# Patient Record
Sex: Female | Born: 1995
Health system: Southern US, Community
[De-identification: ages and names within clinical notes are randomized; demographics above are authoritative.]

## PROBLEM LIST (undated history)

## (undated) DIAGNOSIS — E039 Hypothyroidism, unspecified: Secondary | ICD-10-CM

## (undated) DIAGNOSIS — F411 Generalized anxiety disorder: Secondary | ICD-10-CM

## (undated) DIAGNOSIS — T7840XA Allergy, unspecified, initial encounter: Secondary | ICD-10-CM

## (undated) DIAGNOSIS — N926 Irregular menstruation, unspecified: Secondary | ICD-10-CM

## (undated) DIAGNOSIS — E282 Polycystic ovarian syndrome: Secondary | ICD-10-CM

## (undated) DIAGNOSIS — L680 Hirsutism: Secondary | ICD-10-CM

## (undated) HISTORY — DX: Hirsutism: L68.0

## (undated) HISTORY — DX: Allergy, unspecified, initial encounter: T78.40XA

## (undated) HISTORY — DX: Generalized anxiety disorder: F41.1

## (undated) HISTORY — DX: Irregular menstruation, unspecified: N92.6

## (undated) HISTORY — DX: Polycystic ovarian syndrome: E28.2

## (undated) HISTORY — DX: Hypothyroidism, unspecified: E03.9

---
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
## 2016-06-26 ENCOUNTER — Encounter (HOSPITAL_COMMUNITY): Payer: Self-pay | Admitting: Emergency Medicine

## 2016-06-26 DIAGNOSIS — N3001 Acute cystitis with hematuria: Secondary | ICD-10-CM | POA: Diagnosis not present

## 2016-06-26 DIAGNOSIS — R05 Cough: Secondary | ICD-10-CM | POA: Insufficient documentation

## 2016-06-26 NOTE — ED Triage Notes (Signed)
Pt comes with complaints of generalized body aches, emesis, dry cough, and low grade fever. Ambulatory during triage.

## 2016-06-27 ENCOUNTER — Emergency Department (HOSPITAL_COMMUNITY)
Admission: EM | Admit: 2016-06-27 | Discharge: 2016-06-27 | Disposition: A | Discharge status: Home / Self Care | Payer: Federal, State, Local not specified - PPO | Attending: Emergency Medicine | Admitting: Emergency Medicine

## 2016-06-27 DIAGNOSIS — R6889 Other general symptoms and signs: Secondary | ICD-10-CM

## 2016-06-27 DIAGNOSIS — N3001 Acute cystitis with hematuria: Secondary | ICD-10-CM

## 2016-06-27 LAB — URINALYSIS, ROUTINE W REFLEX MICROSCOPIC
Bilirubin Urine: NEGATIVE
Glucose, UA: NEGATIVE mg/dL
Ketones, ur: NEGATIVE mg/dL
Nitrite: POSITIVE — AB
PROTEIN: 30 mg/dL — AB
Specific Gravity, Urine: 1.017 (ref 1.005–1.030)
pH: 6 (ref 5.0–8.0)

## 2016-06-27 LAB — POC URINE PREG, ED: Preg Test, Ur: NEGATIVE

## 2016-06-27 MED ORDER — ONDANSETRON 4 MG PO TBDP
4.0000 mg | ORAL_TABLET | Freq: Once | ORAL | Status: AC
  Administered 2016-06-27: 4 mg via ORAL
  Filled 2016-06-27: qty 1

## 2016-06-27 MED ORDER — BENZONATATE 100 MG PO CAPS: 100.0000 mg | ORAL_CAPSULE | Freq: Three times a day (TID) | ORAL | 0 refills | Status: DC

## 2016-06-27 MED ORDER — IBUPROFEN 800 MG PO TABS: 800.0000 mg | ORAL_TABLET | Freq: Three times a day (TID) | ORAL | 0 refills | Status: DC

## 2016-06-27 MED ORDER — ONDANSETRON HCL 4 MG PO TABS: 4.0000 mg | ORAL_TABLET | Freq: Four times a day (QID) | ORAL | 0 refills | Status: DC

## 2016-06-27 MED ORDER — CEPHALEXIN 500 MG PO CAPS: 500.0000 mg | ORAL_CAPSULE | Freq: Two times a day (BID) | ORAL | 0 refills | Status: DC

## 2016-06-27 MED ORDER — IBUPROFEN 200 MG PO TABS
600.0000 mg | ORAL_TABLET | Freq: Once | ORAL | Status: AC
  Administered 2016-06-27: 600 mg via ORAL
  Filled 2016-06-27: qty 3

## 2016-06-27 MED ORDER — CEPHALEXIN 500 MG PO CAPS
500.0000 mg | ORAL_CAPSULE | Freq: Once | ORAL | Status: AC
  Administered 2016-06-27: 500 mg via ORAL
  Filled 2016-06-27: qty 1

## 2016-06-27 MED ORDER — OSELTAMIVIR PHOSPHATE 75 MG PO CAPS: 75.0000 mg | ORAL_CAPSULE | Freq: Two times a day (BID) | ORAL | 0 refills | Status: DC

## 2016-06-27 NOTE — Discharge Instructions (Signed)
Medications: Keflex, Tamiflu, ibuprofen, Zofran, Tessalon  Treatment: Take Keflex twice daily for 1 week for your urinary tract infection. Take Tamiflu twice daily for 5 days. Take ibuprofen every for your body. You can alternate with Tylenol as degenerative over-the-counter. Take Zofran every 6 hours as needed for nausea and vomiting. Take Tessalon every 8 hours as needed for cough. Make sure to get plenty of rest and drink plenty of fluids (water and Gatorade).  Follow-up: Please return to emergency department if you develop any new or worsening symptoms.

## 2016-06-27 NOTE — ED Provider Notes (Signed)
[facility]-EMERGENCY DEPT Provider Note   CSN: [id number] Arrival date & time: 06/26/16  1945  By signing my name below, I, Modena Jansky, attest that this documentation has been prepared under the direction and in the presence of non-physician practitioner, Glenford Bayley, PA-C. Electronically Signed: Modena Jansky, Scribe. 06/27/2016. 12:50 AM.  History   Chief Complaint Chief Complaint  Patient presents with  . Flu Like Symptoms   The history is provided by the patient. No language interpreter was used.   HPI Comments: Tanya Hanson is a 21 y.o. female who presents to the Emergency Department complaining of intermittent moderate cough that started this morning. She states she had a sudden onset of gradually worsening URI-like symptoms. No treatment PTA. She reports associated symptoms of weakness (generalized), chills, nausea, vomiting (one episode), and subjective fever. Pt's temperature in the ED today was 99.9. She denies any influenza vaccination this year, diarrhea, urinary symptoms, or other complaints.    History reviewed. No pertinent past medical history.  There are no active problems to display for this patient.   History reviewed. No pertinent surgical history.  OB History    No data available       Home Medications    Prior to Admission medications   Medication Sig Start Date End Date Taking? Authorizing Provider  benzonatate (TESSALON) 100 MG capsule Take 1 capsule (100 mg total) by mouth every 8 (eight) hours. 06/27/16   Emi Holes, PA-C  cephALEXin (KEFLEX) 500 MG capsule Take 1 capsule (500 mg total) by mouth 2 (two) times daily. 06/27/16   Emi Holes, PA-C  ibuprofen (ADVIL,MOTRIN) 800 MG tablet Take 1 tablet (800 mg total) by mouth 3 (three) times daily. 06/27/16   Malaiya Paczkowski M Lionardo Haze, PA-C  ondansetron (ZOFRAN) 4 MG tablet Take 1 tablet (4 mg total) by mouth every 6 (six) hours. 06/27/16   Emi Holes, PA-C  oseltamivir (TAMIFLU) 75 MG capsule Take 1  capsule (75 mg total) by mouth every 12 (twelve) hours. 06/27/16   Emi Holes, PA-C    Family History No family history on file.  Social History Social History  Substance Use Topics  . Smoking status: Never Smoker  . Smokeless tobacco: Never Used  . Alcohol use No     Allergies   Patient has no known allergies.   Review of Systems Review of Systems  Constitutional: Positive for chills and fever (Subjective).  HENT: Negative for facial swelling and sore throat.   Respiratory: Positive for cough. Negative for shortness of breath.   Cardiovascular: Negative for chest pain.  Gastrointestinal: Positive for nausea and vomiting (one episode). Negative for abdominal pain and diarrhea.  Genitourinary: Negative for dysuria.  Musculoskeletal: Negative for back pain.  Skin: Negative for rash and wound.  Neurological: Positive for weakness (Generalized). Negative for headaches.  Psychiatric/Behavioral: The patient is not nervous/anxious.      Physical Exam Updated Vital Signs BP 116/97 (BP Location: Left Arm)   Pulse 101   Temp 100.3 F (37.9 C) (Oral)   Resp 19   Ht 5\' 5"  (1.651 m)   Wt 258 lb 6.4 oz (117.2 kg)   LMP 06/20/2016   SpO2 100%   BMI 43.00 kg/m   Physical Exam  Constitutional: She appears well-developed and well-nourished. No distress.  HENT:  Head: Normocephalic and atraumatic.  Right Ear: Tympanic membrane normal.  Left Ear: Tympanic membrane normal.  Mouth/Throat: Oropharynx is clear and moist. No oropharyngeal exudate.  Eyes: Conjunctivae are normal. Pupils  are equal, round, and reactive to light. Right eye exhibits no discharge. Left eye exhibits no discharge. No scleral icterus.  Neck: Normal range of motion. Neck supple. No thyromegaly present.  Cardiovascular: Regular rhythm, normal heart sounds and intact distal pulses.  Exam reveals no gallop and no friction rub.   No murmur heard. Pulmonary/Chest: Effort normal and breath sounds normal. No  stridor. No respiratory distress. She has no wheezes. She has no rales.  Abdominal: Soft. Bowel sounds are normal. She exhibits no distension. There is no tenderness. There is no rebound and no guarding.  Musculoskeletal: She exhibits no edema.  Lymphadenopathy:    She has no cervical adenopathy.  Neurological: She is alert. Coordination normal.  Skin: Skin is warm and dry. No rash noted. She is not diaphoretic. No pallor.  Psychiatric: She has a normal mood and affect.  Nursing note and vitals reviewed.    ED Treatments / Results  DIAGNOSTIC STUDIES: Oxygen Saturation is 100% on RA, normal by my interpretation.    COORDINATION OF CARE: 12:54 AM- Pt advised of plan for treatment and pt agrees.  Labs (all labs ordered are listed, but only abnormal results are displayed) Labs Reviewed  URINALYSIS, ROUTINE W REFLEX MICROSCOPIC - Abnormal; Notable for the following:       Result Value   APPearance CLOUDY (*)    Hgb urine dipstick SMALL (*)    Protein, ur 30 (*)    Nitrite POSITIVE (*)    Leukocytes, UA LARGE (*)    Bacteria, UA MANY (*)    Squamous Epithelial / LPF 0-5 (*)    All other components within normal limits  URINE CULTURE  POC URINE PREG, ED    EKG  EKG Interpretation None       Radiology No results found.  Procedures Procedures (including critical care time)  Medications Ordered in ED Medications  ibuprofen (ADVIL,MOTRIN) tablet 600 mg (600 mg Oral Given 06/27/16 0105)  ondansetron (ZOFRAN-ODT) disintegrating tablet 4 mg (4 mg Oral Given 06/27/16 0105)  cephALEXin (KEFLEX) capsule 500 mg (500 mg Oral Given 06/27/16 0121)     Initial Impression / Assessment and Plan / ED Course  I have reviewed the triage vital signs and the nursing notes.  Pertinent labs & imaging results that were available during my care of the patient were reviewed by me and considered in my medical decision making (see chart for details).     Patient with symptoms consistent  with influenza.  Vitals are stable, low-grade fever.  No signs of dehydration, tolerating PO's.  Lungs are clear. Due to patient's presentation and physical exam a chest x-ray was not ordered bc likely diagnosis of flu.  Discussed the cost versus benefit of Tamiflu treatment with the patient.  Patient would like prescription.  Patient will be discharged with instructions to orally hydrate, rest, and use over-the-counter medications such as anti-inflammatories ibuprofen and Aleve for muscle aches and Tylenol for fever.  Patient will also be given a cough suppressant. Patient also with urinary tract infection, UA showed small hematuria, positive nitrites, large leukocytes, too numerous to count WBCs, many bacteria. Sent. First dose of Keflex given in ED and discharged home with Keflex. Urine culture sent. Urine pregnancy negative. Patient tachycardia improved in ED with control of fever and oral hydration. Return precautions discussed. Patient understands and agrees with plan. Patient discharged in satisfactory condition.   Final Clinical Impressions(s) / ED Diagnoses   Final diagnoses:  Influenza-like symptoms  Acute cystitis with hematuria  New Prescriptions New Prescriptions   BENZONATATE (TESSALON) 100 MG CAPSULE    Take 1 capsule (100 mg total) by mouth every 8 (eight) hours.   CEPHALEXIN (KEFLEX) 500 MG CAPSULE    Take 1 capsule (500 mg total) by mouth 2 (two) times daily.   IBUPROFEN (ADVIL,MOTRIN) 800 MG TABLET    Take 1 tablet (800 mg total) by mouth 3 (three) times daily.   ONDANSETRON (ZOFRAN) 4 MG TABLET    Take 1 tablet (4 mg total) by mouth every 6 (six) hours.   OSELTAMIVIR (TAMIFLU) 75 MG CAPSULE    Take 1 capsule (75 mg total) by mouth every 12 (twelve) hours.   I personally performed the services described in this documentation, which was scribed in my presence. The recorded information has been reviewed and is accurate.     Emi Holeslexandra M Effrey Davidow, PA-C 06/27/16 [id number]    April  Palumbo, MD 06/27/16 [phone]

## 2016-06-27 NOTE — ED Notes (Signed)
Pt states she is unable to provide a urine specimen at this time.

## 2016-06-29 LAB — URINE CULTURE
Culture: >=100000 — AB
Special Requests: NORMAL

## 2016-06-30 ENCOUNTER — Telehealth: Payer: Self-pay | Admitting: Emergency Medicine

## 2016-06-30 NOTE — Telephone Encounter (Signed)
Post ED Visit - Positive Culture Follow-up  Culture report reviewed by antimicrobial stewardship pharmacist:  []  Enzo BiNathan Batchelder, Pharm.D. []  Celedonio MiyamotoJeremy Frens, Pharm.D., BCPS []  Garvin FilaMike Maccia, Pharm.D. []  Georgina PillionElizabeth Martin, Pharm.D., BCPS []  GalenaMinh Pham, [address]Pharm[address]D., BCPS, AAHIVP []  Estella HuskMichelle Turner, Pharm.D., BCPS, AAHIVP []  Tennis Mustassie Stewart, Pharm.D. []  Sherle Poeob Vincent, [address]Pharm[address]Carylon Perches. Maggie Shuda PharmD  Positive urine culture Treated with cephalexin, organism sensitive to the same and no further patient follow-up is required at this time.  Berle MullMiller, Joevanni Roddey 06/30/2016, 10:37 AM

## 2016-10-29 ENCOUNTER — Encounter (HOSPITAL_COMMUNITY): Payer: Self-pay | Admitting: Emergency Medicine

## 2016-10-29 DIAGNOSIS — Z5321 Procedure and treatment not carried out due to patient leaving prior to being seen by health care provider: Secondary | ICD-10-CM | POA: Diagnosis not present

## 2016-10-29 DIAGNOSIS — Z79899 Other long term (current) drug therapy: Secondary | ICD-10-CM | POA: Insufficient documentation

## 2016-10-29 DIAGNOSIS — R51 Headache: Secondary | ICD-10-CM | POA: Diagnosis not present

## 2016-10-29 LAB — POC URINE PREG, ED: PREG TEST UR: NEGATIVE

## 2016-10-29 MED ORDER — ACETAMINOPHEN 325 MG PO TABS
650.0000 mg | ORAL_TABLET | Freq: Once | ORAL | Status: AC
  Administered 2016-10-29: 650 mg via ORAL

## 2016-10-29 MED ORDER — ONDANSETRON 4 MG PO TBDP
ORAL_TABLET | ORAL | Status: AC
  Filled 2016-10-29: qty 2

## 2016-10-29 MED ORDER — ACETAMINOPHEN 325 MG PO TABS
ORAL_TABLET | ORAL | Status: AC
  Filled 2016-10-29: qty 2

## 2016-10-29 MED ORDER — ONDANSETRON 4 MG PO TBDP
8.0000 mg | ORAL_TABLET | Freq: Once | ORAL | Status: AC
  Administered 2016-10-29: 8 mg via ORAL

## 2016-10-29 NOTE — ED Triage Notes (Signed)
Pt c/o frontal R sided HA onset yesterday, pt reports no relief with ibuprofen. Mild intermittent nausea with photophobia. A & O, neuro intact.

## 2016-10-30 ENCOUNTER — Emergency Department (HOSPITAL_COMMUNITY)
Admission: EM | Admit: 2016-10-30 | Discharge: 2016-10-30 | Disposition: A | Discharge status: Home / Self Care | Payer: Federal, State, Local not specified - PPO | Attending: Emergency Medicine | Admitting: Emergency Medicine

## 2016-10-30 NOTE — ED Notes (Signed)
Pt called in waiting room. No answer, pt moved off the floor.

## 2016-10-30 NOTE — ED Notes (Signed)
Pt called for room x2, no answer ?

## 2016-11-05 ENCOUNTER — Emergency Department (HOSPITAL_COMMUNITY)
Admission: EM | Admit: 2016-11-05 | Discharge: 2016-11-05 | Disposition: A | Discharge status: Home / Self Care | Payer: Federal, State, Local not specified - PPO | Attending: Emergency Medicine | Admitting: Emergency Medicine

## 2016-11-05 ENCOUNTER — Encounter (HOSPITAL_COMMUNITY): Payer: Self-pay | Admitting: Nurse Practitioner

## 2016-11-05 ENCOUNTER — Emergency Department (HOSPITAL_COMMUNITY): Payer: Federal, State, Local not specified - PPO

## 2016-11-05 DIAGNOSIS — Y999 Unspecified external cause status: Secondary | ICD-10-CM | POA: Insufficient documentation

## 2016-11-05 DIAGNOSIS — S92354A Nondisplaced fracture of fifth metatarsal bone, right foot, initial encounter for closed fracture: Secondary | ICD-10-CM | POA: Insufficient documentation

## 2016-11-05 DIAGNOSIS — X509XXA Other and unspecified overexertion or strenuous movements or postures, initial encounter: Secondary | ICD-10-CM | POA: Diagnosis not present

## 2016-11-05 DIAGNOSIS — Y9301 Activity, walking, marching and hiking: Secondary | ICD-10-CM | POA: Insufficient documentation

## 2016-11-05 DIAGNOSIS — Z79899 Other long term (current) drug therapy: Secondary | ICD-10-CM | POA: Diagnosis not present

## 2016-11-05 DIAGNOSIS — S93401A Sprain of unspecified ligament of right ankle, initial encounter: Secondary | ICD-10-CM

## 2016-11-05 DIAGNOSIS — S92901A Unspecified fracture of right foot, initial encounter for closed fracture: Secondary | ICD-10-CM

## 2016-11-05 DIAGNOSIS — Y92481 Parking lot as the place of occurrence of the external cause: Secondary | ICD-10-CM | POA: Diagnosis not present

## 2016-11-05 DIAGNOSIS — S99921A Unspecified injury of right foot, initial encounter: Secondary | ICD-10-CM | POA: Diagnosis present

## 2016-11-05 MED ORDER — HYDROCODONE-ACETAMINOPHEN 5-325 MG PO TABS: 1.0000 | ORAL_TABLET | Freq: Four times a day (QID) | ORAL | 0 refills | Status: DC | PRN

## 2016-11-05 MED ORDER — HYDROCODONE-ACETAMINOPHEN 5-325 MG PO TABS
1.0000 | ORAL_TABLET | Freq: Once | ORAL | Status: AC
  Administered 2016-11-05: 1 via ORAL
  Filled 2016-11-05: qty 1

## 2016-11-05 MED ORDER — DICLOFENAC SODIUM 50 MG PO TBEC: 50.0000 mg | DELAYED_RELEASE_TABLET | Freq: Two times a day (BID) | ORAL | 0 refills | Status: DC

## 2016-11-05 NOTE — Discharge Instructions (Signed)
Call Dr. Logan BoresEvans' office in the morning and tell them you were seen in the ED and referred to him for follow up. Wear the boot for the broken bone in your foot.

## 2016-11-05 NOTE — ED Notes (Signed)
Called Ortho to apply Cam Walker

## 2016-11-05 NOTE — Progress Notes (Signed)
Orthopedic Tech Progress Note Patient Details:  Delman Kittenaseyana Q Osterloh 1995/06/08 [id number]  Ortho Devices Type of Ortho Device: Crutches, CAM walker Ortho Device/Splint Location: RLE Ortho Device/Splint Interventions: Ordered, Application, Adjustment   Jennye MoccasinHughes, Kalix Meinecke Craig 11/05/2016, 7:24 PM

## 2016-11-05 NOTE — ED Notes (Signed)
Ortho to come to place [employer] Walker

## 2016-11-05 NOTE — ED Notes (Signed)
Pt taken to Xray.

## 2016-11-05 NOTE — ED Provider Notes (Signed)
[facility]-EMERGENCY DEPT Provider Note   CSN: [id number] Arrival date & time: 11/05/16  1606  By signing my name below, I, Rosana Fret, attest that this documentation has been prepared under the direction and in the presence of non-physician practitioner, Kerrie Buffalo, FNP. Electronically Signed: Rosana Fret, ED Scribe. 11/05/16. 5:29 PM.  History   Chief Complaint Chief Complaint  Patient presents with  . Foot Injury   The history is provided by the patient. No language interpreter was used.   HPI Comments: Tanya Hanson is a 21 y.o. female who presents to the Emergency Department complaining of sudden onset, constant right ankle pain . Pt states she was walking a missed a step, twisting her ankle. Per pt, her pain is exacerbated by ambulation and direct pressure to the area. Pt reports associated swelling tot he area.  Pt has tried icing the area and taking Ibuprofen with no relief of her pain. No other complaints at this time.  History reviewed. No pertinent past medical history.  There are no active problems to display for this patient.   History reviewed. No pertinent surgical history.  OB History    No data available       Home Medications    Prior to Admission medications   Medication Sig Start Date End Date Taking? Authorizing Provider  benzonatate (TESSALON) 100 MG capsule Take 1 capsule (100 mg total) by mouth every 8 (eight) hours. 06/27/16   Law, Waylan Boga, PA-C  cephALEXin (KEFLEX) 500 MG capsule Take 1 capsule (500 mg total) by mouth 2 (two) times daily. 06/27/16   Law, Waylan Boga, PA-C  diclofenac (VOLTAREN) 50 MG EC tablet Take 1 tablet (50 mg total) by mouth 2 (two) times daily. 11/05/16   Janne Napoleon, NP  HYDROcodone-acetaminophen (NORCO) 5-325 MG tablet Take 1 tablet by mouth every 6 (six) hours as needed. 11/05/16   Janne Napoleon, NP  ibuprofen (ADVIL,MOTRIN) 800 MG tablet Take 1 tablet (800 mg total) by mouth 3 (three) times daily. 06/27/16   Law,  Waylan Boga, PA-C  ondansetron (ZOFRAN) 4 MG tablet Take 1 tablet (4 mg total) by mouth every 6 (six) hours. 06/27/16   Law, Waylan Boga, PA-C  oseltamivir (TAMIFLU) 75 MG capsule Take 1 capsule (75 mg total) by mouth every 12 (twelve) hours. 06/27/16   Emi Holes, PA-C    Family History History reviewed. No pertinent family history.  Social History Social History  Substance Use Topics  . Smoking status: Never Smoker  . Smokeless tobacco: Never Used  . Alcohol use No     Allergies   Patient has no known allergies.   Review of Systems Review of Systems  Gastrointestinal: Negative for nausea.  Musculoskeletal: Positive for arthralgias, joint swelling and myalgias.  Skin: Negative for wound.  Psychiatric/Behavioral: The patient is not nervous/anxious.      Physical Exam Updated Vital Signs BP 121/68 (BP Location: Right Arm)   Pulse 70   Temp 98.4 F (36.9 C) (Oral)   Resp 18   SpO2 98%   Physical Exam  Constitutional: She is oriented to person, place, and time. She appears well-developed and well-nourished.  HENT:  Head: Normocephalic and atraumatic.  Eyes: EOM are normal.  Neck: Neck supple.  Cardiovascular: Normal rate and intact distal pulses.   Pulmonary/Chest: Effort normal.  Musculoskeletal: She exhibits edema and tenderness. She exhibits no deformity.  Pedal pulse is 2+. Adequate circulation. Swelling to the lateral and medial aspect of the right ankle. Limited  ROM due to pain. Swelling to the dorsum of the right foot and tenderness to the lateral aspect.   Neurological: She is alert and oriented to person, place, and time. No sensory deficit.  Skin: Skin is warm and dry.  Psychiatric: She has a normal mood and affect. Her behavior is normal.  Nursing note and vitals reviewed.    ED Treatments / Results  DIAGNOSTIC STUDIES: Oxygen Saturation is 99% on RA, normal by my interpretation.   COORDINATION OF CARE: 5:29 PM-Discussed next steps with pt  including XR. Pt verbalized understanding and is agreeable with the plan.  6:28 PM Consulted with a peditrist, Dr. Logan BoresEvans who recommends CAM walker and follow up in the office.   Labs (all labs ordered are listed, but only abnormal results are displayed) Labs Reviewed - No data to display   Radiology Dg Ankle Complete Right  Result Date: 11/05/2016 CLINICAL DATA:  Fall today.  Lateral right foot and ankle pain . EXAM: RIGHT ANKLE - COMPLETE 3+ VIEW COMPARISON:  None. FINDINGS: Nondisplaced intra-articular right base of fifth metatarsal fracture with surrounding soft tissue swelling. No additional fracture. No right ankle subluxation. No suspicious focal osseous lesion. No significant arthropathy. No radiopaque foreign body. IMPRESSION: 1. Nondisplaced intra-articular right base of fifth metatarsal fracture . 2. No right ankle fracture or subluxation. Electronically Signed   By: Delbert PhenixJason A Poff M.D.   On: 11/05/2016 18:09   Dg Foot Complete Right  Result Date: 11/05/2016 CLINICAL DATA:  Fall today.  Lateral right foot and ankle pain. EXAM: RIGHT FOOT COMPLETE - 3+ VIEW COMPARISON:  None. FINDINGS: Nondisplaced intra-articular right base of fifth metatarsal fracture with surrounding soft tissue swelling. No additional fracture. No dislocation. No suspicious focal osseous lesion. No significant arthropathy. No radiopaque foreign body. IMPRESSION: Nondisplaced intra-articular right base of fifth metatarsal fracture. Electronically Signed   By: Delbert PhenixJason A Poff M.D.   On: 11/05/2016 18:08    Procedures Procedures (including critical care time)  Medications Ordered in ED Medications  HYDROcodone-acetaminophen (NORCO/VICODIN) 5-325 MG per tablet 1 tablet (1 tablet Oral Given 11/05/16 1735)     Initial Impression / Assessment and Plan / ED Course  I have reviewed the triage vital signs and the nursing notes.   Final Clinical Impressions(s) / ED Diagnoses  21 y.o. female with right foot and ankle pain  stable for d/c without focal neuro deficits. X-ray with nondisplaced fracture of the right foot. Cam walker, pain management  and follow up with Dr. Logan BoresEvans.  Final diagnoses:  Foot fracture, right, closed, initial encounter  Sprain of right ankle, unspecified ligament, initial encounter    New Prescriptions New Prescriptions   DICLOFENAC (VOLTAREN) 50 MG EC TABLET    Take 1 tablet (50 mg total) by mouth 2 (two) times daily.   HYDROCODONE-ACETAMINOPHEN (NORCO) 5-325 MG TABLET    Take 1 tablet by mouth every 6 (six) hours as needed.  I personally performed the services described in this documentation, which was scribed in my presence. The recorded information has been reviewed and is accurate.     Kerrie Buffaloeese, [place], [place]NP 11/05/16 Epimenio [id number]    Alvira MondaySchlossman, Erin, MD 11/07/16 [phone]

## 2016-11-05 NOTE — ED Notes (Signed)
Paged Ortho x 2. 

## 2016-11-05 NOTE — ED Triage Notes (Signed)
Pt presents with right foot injury. The injury occurred when she mis stepped in a parking lot this morning. she's had right foot and ankle pain since. Shes been applying ice with no improvement

## 2016-11-10 ENCOUNTER — Ambulatory Visit (INDEPENDENT_AMBULATORY_CARE_PROVIDER_SITE_OTHER): Payer: Federal, State, Local not specified - PPO | Admitting: Podiatry

## 2016-11-10 DIAGNOSIS — S92354A Nondisplaced fracture of fifth metatarsal bone, right foot, initial encounter for closed fracture: Secondary | ICD-10-CM

## 2016-11-19 NOTE — Progress Notes (Signed)
   HPI: Patient is a 21 year old female presenting today complaining of shooting pain to the lateral side of the right foot that has been present for the past week when she injured the foot. She was evaluated in the ED and a cam boot was dispensed. Applying pressure and walking increases the pain. She has been taking Vicodin and diclofenac which helps provide relief of the pain. She is here for further evaluation and treatment.    Physical Exam: General: The patient is alert and oriented x3 in no acute distress.  Dermatology: Skin is warm, dry and supple bilateral lower extremities. Negative for open lesions or macerations.  Vascular: Palpable pedal pulses bilaterally. No edema or erythema noted. Capillary refill within normal limits.  Neurological: Epicritic and protective threshold grossly intact bilaterally.   Musculoskeletal Exam: Pain with palpation to the fifth metatarsal of the right foot. Range of motion within normal limits to all pedal and ankle joints bilateral. Muscle strength 5/5 in all groups bilateral.    Assessment: 1. Nondisplaced fifth metatarsal fracture of the right foot.   Plan of Care:  1. Patient was evaluated. 2. Cam boot dispensed. 3. Nonweightbearing for 6-8 weeks. 4. Return to clinic in 4 weeks.   Felecia ShellingBrent M. Seena Ritacco, DPM [facility]  Dr. Felecia ShellingBrent M. Marqueze Ramcharan, DPM    [address]                                        [place], [place] [address]                Office [phone]) [phone]  Fax [phone](336) [phone]

## 2016-12-01 ENCOUNTER — Ambulatory Visit (INDEPENDENT_AMBULATORY_CARE_PROVIDER_SITE_OTHER): Payer: Federal, State, Local not specified - PPO

## 2016-12-01 ENCOUNTER — Ambulatory Visit (INDEPENDENT_AMBULATORY_CARE_PROVIDER_SITE_OTHER): Payer: Federal, State, Local not specified - PPO | Admitting: Podiatry

## 2016-12-01 ENCOUNTER — Encounter: Payer: Self-pay | Admitting: Podiatry

## 2016-12-01 DIAGNOSIS — S92354A Nondisplaced fracture of fifth metatarsal bone, right foot, initial encounter for closed fracture: Secondary | ICD-10-CM | POA: Diagnosis not present

## 2016-12-01 DIAGNOSIS — S92354D Nondisplaced fracture of fifth metatarsal bone, right foot, subsequent encounter for fracture with routine healing: Secondary | ICD-10-CM | POA: Diagnosis not present

## 2016-12-03 NOTE — Progress Notes (Signed)
   HPI: Patient is a 21 year old female presenting today for follow up evaluation of a fractured fifth metatarsal of the right foot. She states the foot is doing much better and denies continued pain. She denies any new complaints at this time.   Physical Exam: General: The patient is alert and oriented x3 in no acute distress.  Dermatology: Skin is warm, dry and supple bilateral lower extremities. Negative for open lesions or macerations.  Vascular: Palpable pedal pulses bilaterally. No edema or erythema noted. Capillary refill within normal limits.  Neurological: Epicritic and protective threshold grossly intact bilaterally.   Musculoskeletal Exam: Minimal pain with palpation to the fifth metatarsal of the right foot. Range of motion within normal limits to all pedal and ankle joints bilateral. Muscle strength 5/5 in all groups bilateral.    Assessment: 1. Nondisplaced fifth metatarsal fracture of the right foot.   Plan of Care:  1. Patient was evaluated. X-Rays reviewed. 2. Discontinue wearing CAM boot. Recommended good sneakers. 3. Slowly increase activity over the next 4-6 weeks. 4. Return to clinic when necessary.   Felecia ShellingBrent M. Chantrell Apsey, DPM [facility]  Dr. Felecia ShellingBrent M. Camden Mazzaferro, DPM    [address]                                        [place], [place] [address]                Office [phone]  Fax [phone]

## 2017-05-14 ENCOUNTER — Emergency Department (HOSPITAL_COMMUNITY)
Admission: EM | Admit: 2017-05-14 | Discharge: 2017-05-14 | Disposition: A | Discharge status: Home / Self Care | Payer: Federal, State, Local not specified - PPO | Attending: Emergency Medicine | Admitting: Emergency Medicine

## 2017-05-14 ENCOUNTER — Other Ambulatory Visit: Payer: Self-pay

## 2017-05-14 ENCOUNTER — Encounter (HOSPITAL_COMMUNITY): Payer: Self-pay

## 2017-05-14 DIAGNOSIS — J069 Acute upper respiratory infection, unspecified: Secondary | ICD-10-CM

## 2017-05-14 DIAGNOSIS — Z79899 Other long term (current) drug therapy: Secondary | ICD-10-CM | POA: Insufficient documentation

## 2017-05-14 DIAGNOSIS — Z791 Long term (current) use of non-steroidal anti-inflammatories (NSAID): Secondary | ICD-10-CM | POA: Diagnosis not present

## 2017-05-14 DIAGNOSIS — H9203 Otalgia, bilateral: Secondary | ICD-10-CM | POA: Diagnosis not present

## 2017-05-14 DIAGNOSIS — R0981 Nasal congestion: Secondary | ICD-10-CM | POA: Diagnosis not present

## 2017-05-14 MED ORDER — FLUTICASONE PROPIONATE 50 MCG/ACT NA SUSP: 1.0000 | Freq: Every day | NASAL | 0 refills | Status: DC

## 2017-05-14 MED ORDER — CETIRIZINE-PSEUDOEPHEDRINE ER 5-120 MG PO TB12: 1.0000 | ORAL_TABLET | Freq: Two times a day (BID) | ORAL | 0 refills | Status: DC

## 2017-05-14 NOTE — ED Provider Notes (Signed)
[name] Provider Note   CSN: [id number] Arrival date & time: 05/14/17  0857     History   Chief Complaint Chief Complaint  Patient presents with  . congestion/earache    HPI Tanya Hanson is a 21 y.o. female who is previously healthy who presents with a 5-day history of nasal congestion and bilateral ear pain.  Patient reports she started with a sore throat, however then developed nasal congestion and ear pain.  Her sore throat has resolved.  She denies any fevers.  She has had a very sparse, nonproductive cough, but not very often.  She has taken Mucinex over-the-counter with some relief.  She denies any chest pain, shortness of breath, or abdominal pain.  HPI  History reviewed. No pertinent past medical history.  There are no active problems to display for this patient.   History reviewed. No pertinent surgical history.  OB History    No data available       Home Medications    Prior to Admission medications   Medication Sig Start Date End Date Taking? Authorizing Provider  benzonatate (TESSALON) 100 MG capsule Take 1 capsule (100 mg total) by mouth every 8 (eight) hours. 06/27/16   Marlei Glomski, Waylan BogaAlexandra M, PA-C  cephALEXin (KEFLEX) 500 MG capsule Take 1 capsule (500 mg total) by mouth 2 (two) times daily. 06/27/16   Yukie Bergeron, Waylan BogaAlexandra M, PA-C  cetirizine-pseudoephedrine (ZYRTEC-D) 5-120 MG tablet Take 1 tablet by mouth 2 (two) times daily. 05/14/17   Leslee Haueter, Waylan BogaAlexandra M, PA-C  fluticasone (FLONASE) 50 MCG/ACT nasal spray Place 1 spray into both nostrils daily. 05/14/17   Nayelly Laughman, Waylan BogaAlexandra M, PA-C  HYDROcodone-acetaminophen (NORCO) 5-325 MG tablet Take 1 tablet by mouth every 6 (six) hours as needed. 11/05/16   Janne NapoleonNeese, Hope M, NP  ibuprofen (ADVIL,MOTRIN) 800 MG tablet Take 1 tablet (800 mg total) by mouth 3 (three) times daily. 06/27/16   Armstrong Creasy, Waylan BogaAlexandra M, PA-C  ondansetron (ZOFRAN) 4 MG tablet Take 1 tablet (4 mg total) by mouth every 6 (six)  hours. 06/27/16   Ocia Simek, Waylan BogaAlexandra M, PA-C  oseltamivir (TAMIFLU) 75 MG capsule Take 1 capsule (75 mg total) by mouth every 12 (twelve) hours. 06/27/16   Emi HolesLaw, Cherie Lasalle M, PA-C    Family History No family history on file.  Social History Social History   Tobacco Use  . Smoking status: Never Smoker  . Smokeless tobacco: Never Used  Substance Use Topics  . Alcohol use: No  . Drug use: No     Allergies   Patient has no known allergies.   Review of Systems Review of Systems  Constitutional: Negative for fever.  HENT: Positive for congestion, ear pain and rhinorrhea.   Respiratory: Positive for cough (very sparse). Negative for shortness of breath.   Cardiovascular: Negative for chest pain.  Gastrointestinal: Negative for abdominal pain.     Physical Exam Updated Vital Signs BP (!) 121/92 (BP Location: Right Arm)   Pulse 94   Temp 98.9 F (37.2 C) (Oral)   Resp 18   SpO2 100%   Physical Exam  Constitutional: She appears well-developed and well-nourished. No distress.  HENT:  Head: Normocephalic and atraumatic.  Right Ear: Tympanic membrane normal.  Left Ear: Tympanic membrane normal.  Mouth/Throat: Oropharynx is clear and moist. No oropharyngeal exudate, posterior oropharyngeal edema, posterior oropharyngeal erythema or tonsillar abscesses.  No tenderness over the sinuses  Eyes: Conjunctivae are normal. Pupils are equal, round, and reactive to light. Right eye exhibits no  discharge. Left eye exhibits no discharge. No scleral icterus.  Neck: Normal range of motion. Neck supple. No thyromegaly present.  Cardiovascular: Normal rate, regular rhythm, normal heart sounds and intact distal pulses. Exam reveals no gallop and no friction rub.  No murmur heard. Pulmonary/Chest: Effort normal and breath sounds normal. No stridor. No respiratory distress. She has no wheezes. She has no rales.  Abdominal: Soft. Bowel sounds are normal. She exhibits no distension. There is no  tenderness. There is no rebound and no guarding.  Musculoskeletal: She exhibits no edema.  Lymphadenopathy:    She has no cervical adenopathy.  Neurological: She is alert. Coordination normal.  Skin: Skin is warm and dry. No rash noted. She is not diaphoretic. No pallor.  Psychiatric: She has a normal mood and affect.  Nursing note and vitals reviewed.    ED Treatments / Results  Labs (all labs ordered are listed, but only abnormal results are displayed) Labs Reviewed - No data to display  EKG  EKG Interpretation None       Radiology No results found.  Procedures Procedures (including critical care time)  Medications Ordered in ED Medications - No data to display   Initial Impression / Assessment and Plan / ED Course  I have reviewed the triage vital signs and the nursing notes.  Pertinent labs & imaging results that were available during my care of the patient were reviewed by me and considered in my medical decision making (see chart for details).     Pt symptoms consistent with URI.  Lungs are clear to auscultation.  Pt will be discharged with symptomatic treatment, including Flonase, Zyrtec-D.  Discussed return precautions.  Patient understands and agrees with plan.  Patient vitals stable throughout ED course and discharged in satisfactory condition.  Final Clinical Impressions(s) / ED Diagnoses   Final diagnoses:  Upper respiratory tract infection, unspecified type    ED Discharge Orders        Ordered    cetirizine-pseudoephedrine (ZYRTEC-D) 5-120 MG tablet  2 times daily     05/14/17 1045    fluticasone (FLONASE) 50 MCG/ACT nasal spray  Daily     05/14/17 [address]10[address]       Luvada Salamone Clover CreekM, [place] JerseyPA-C 05/14/17 1052    Lavera GuiseLiu, Dana Duo, MD 05/14/17 [phone]

## 2017-05-14 NOTE — ED Triage Notes (Signed)
Patient complains of congestion, runny nose and bilateral ear pain x 4 days

## 2017-05-14 NOTE — Discharge Instructions (Signed)
Medications: Zyrtec-D, Flonase  Treatment: Take Zyrtec-D twice daily as prescribed for nasal congestion.  Also use Flonase twice daily in each nare for nasal congestion.  You can continue taking Mucinex as prescribed over-the-counter  Follow-up: Please return to the emergency department if you develop any new or worsening symptoms including fever over 100.4, shortness of breath, or any other new or concerning symptoms.

## 2017-05-28 ENCOUNTER — Emergency Department (HOSPITAL_COMMUNITY): Payer: Federal, State, Local not specified - PPO

## 2017-05-28 ENCOUNTER — Other Ambulatory Visit: Payer: Self-pay

## 2017-05-28 ENCOUNTER — Emergency Department (HOSPITAL_COMMUNITY)
Admission: EM | Admit: 2017-05-28 | Discharge: 2017-05-29 | Disposition: A | Discharge status: Home / Self Care | Payer: Federal, State, Local not specified - PPO | Attending: Emergency Medicine | Admitting: Emergency Medicine

## 2017-05-28 ENCOUNTER — Encounter (HOSPITAL_COMMUNITY): Payer: Self-pay | Admitting: Emergency Medicine

## 2017-05-28 DIAGNOSIS — R1031 Right lower quadrant pain: Secondary | ICD-10-CM | POA: Diagnosis not present

## 2017-05-28 DIAGNOSIS — Z79899 Other long term (current) drug therapy: Secondary | ICD-10-CM | POA: Insufficient documentation

## 2017-05-28 DIAGNOSIS — R109 Unspecified abdominal pain: Secondary | ICD-10-CM | POA: Diagnosis not present

## 2017-05-28 LAB — URINALYSIS, ROUTINE W REFLEX MICROSCOPIC
BILIRUBIN URINE: NEGATIVE
Glucose, UA: NEGATIVE mg/dL
HGB URINE DIPSTICK: NEGATIVE
KETONES UR: NEGATIVE mg/dL
NITRITE: NEGATIVE
PROTEIN: NEGATIVE mg/dL
SPECIFIC GRAVITY, URINE: 1.005 (ref 1.005–1.030)
pH: 6 (ref 5.0–8.0)

## 2017-05-28 LAB — I-STAT BETA HCG BLOOD, ED (MC, WL, AP ONLY)

## 2017-05-28 LAB — COMPREHENSIVE METABOLIC PANEL
ALBUMIN: 4 g/dL (ref 3.5–5.0)
ALT: 14 U/L (ref 14–54)
ANION GAP: 7 (ref 5–15)
AST: 18 U/L (ref 15–41)
Alkaline Phosphatase: 79 U/L (ref 38–126)
BUN: 7 mg/dL (ref 6–20)
CO2: 25 mmol/L (ref 22–32)
Calcium: 9.2 mg/dL (ref 8.9–10.3)
Chloride: 106 mmol/L (ref 101–111)
Creatinine, Ser: 0.97 mg/dL (ref 0.44–1.00)
GFR calc non Af Amer: >60 mL/min (ref >60)
GLUCOSE: 72 mg/dL (ref 65–99)
POTASSIUM: 3.8 mmol/L (ref 3.5–5.1)
SODIUM: 138 mmol/L (ref 135–145)
TOTAL PROTEIN: 6.7 g/dL (ref 6.5–8.1)
Total Bilirubin: 0.5 mg/dL (ref 0.3–1.2)

## 2017-05-28 LAB — CBC
HEMATOCRIT: 42.9 % (ref 36.0–46.0)
HEMOGLOBIN: 14.3 g/dL (ref 12.0–15.0)
MCH: 31.4 pg (ref 26.0–34.0)
MCHC: 33.3 g/dL (ref 30.0–36.0)
MCV: 94.3 fL (ref 78.0–100.0)
Platelets: 231 10*3/uL (ref 150–400)
RBC: 4.55 MIL/uL (ref 3.87–5.11)
RDW: 13.7 % (ref 11.5–15.5)
WBC: 8.9 10*3/uL (ref 4.0–10.5)

## 2017-05-28 LAB — LIPASE, BLOOD: Lipase: 31 U/L (ref 11–51)

## 2017-05-28 MED ORDER — IOPAMIDOL (ISOVUE-300) INJECTION 61%
INTRAVENOUS | Status: AC
  Administered 2017-05-28: 100 mL
  Filled 2017-05-28: qty 100

## 2017-05-28 MED ORDER — KETOROLAC TROMETHAMINE 30 MG/ML IJ SOLN
30.0000 mg | Freq: Once | INTRAMUSCULAR | Status: AC
  Administered 2017-05-29: 30 mg via INTRAVENOUS
  Filled 2017-05-28: qty 1

## 2017-05-28 MED ORDER — SODIUM CHLORIDE 0.9 % IV BOLUS (SEPSIS)
1000.0000 mL | Freq: Once | INTRAVENOUS | Status: AC
  Administered 2017-05-29: 1000 mL via INTRAVENOUS

## 2017-05-28 NOTE — ED Notes (Signed)
Pt states she has hx of irregular periods, was put on birth control and tried it for 4-5 months and self d/c'd

## 2017-05-28 NOTE — ED Triage Notes (Signed)
Pt reports abd cramping since yesterday, then had onset of "burning" sensation in lower abd. Denies N/V/D. Denies GU S/S.

## 2017-05-28 NOTE — ED Notes (Signed)
Pt ambulatory to bathroom with steady gait at this time; Malawiturkey sandwich and sprite zero provided.

## 2017-05-28 NOTE — ED Notes (Signed)
Patient transported to CT 

## 2017-05-29 MED ORDER — IBUPROFEN 800 MG PO TABS: 800.0000 mg | ORAL_TABLET | Freq: Three times a day (TID) | ORAL | 0 refills | Status: DC | PRN

## 2017-05-29 NOTE — Discharge Instructions (Signed)
Follow-up with the clinic provided or your GYN doctor.  Your CT scan did not show any abnormality your testing did not show any significant abnormalities at this time.  Return here as needed.

## 2017-05-29 NOTE — ED Provider Notes (Signed)
[name] EMERGENCY DEPARTMENT Provider Note   CSN: [id number] Arrival date & time: 05/28/17  1910     History   Chief Complaint Chief Complaint  Patient presents with  . Abdominal Pain    HPI Tanya Hanson is a 21 y.o. female.  HPI Patient presents to the emergency department with lower abdominal discomfort that started a week ago but has gotten worse over the last 2 days.  She states that it is a right lower abdominal discomfort she states that she does not have any pain with urination.  Patient states that she has no nausea vomiting or diarrhea.  Patient denies any abdominal surgeries.  She states that she has had irregular periods and was placed on birth control 4-5 months ago but discontinued this.  Patient states that she has not been sexually active in quite a while.  The patient denies any vaginal bleeding or discharge patient states that the abdominal pain feels menstrual cramping.  The patient denies chest pain, shortness of breath, headache,blurred vision, neck pain, fever, cough, weakness, numbness, dizziness, anorexia, edema, abdominal pain, nausea, vomiting, diarrhea, rash, back pain, dysuria, hematemesis, bloody stool, near syncope, or syncope. History reviewed. No pertinent past medical history.  There are no active problems to display for this patient.   History reviewed. No pertinent surgical history.  OB History    No data available       Home Medications    Prior to Admission medications   Medication Sig Start Date End Date Taking? Authorizing Provider  benzonatate (TESSALON) 100 MG capsule Take 1 capsule (100 mg total) by mouth every 8 (eight) hours. 06/27/16  Yes Law, Alexandra M, PA-C  fluticasone (FLONASE) 50 MCG/ACT nasal spray Place 1 spray into both nostrils daily. 05/14/17  Yes Law, Waylan Boga, PA-C  ibuprofen (ADVIL,MOTRIN) 200 MG tablet Take 400 mg by mouth every 6 (six) hours as needed for mild pain.   Yes [provider]  cephALEXin (KEFLEX) 500 MG capsule Take 1 capsule (500 mg total) by mouth 2 (two) times daily. Patient not taking: Reported on 05/28/2017 06/27/16   Emi Holes, PA-C  cetirizine-pseudoephedrine (ZYRTEC-D) 5-120 MG tablet Take 1 tablet by mouth 2 (two) times daily. Patient not taking: Reported on 05/28/2017 05/14/17   Emi Holes, PA-C  HYDROcodone-acetaminophen (NORCO) 5-325 MG tablet Take 1 tablet by mouth every 6 (six) hours as needed. Patient not taking: Reported on 05/28/2017 11/05/16   Janne Napoleon, NP  ibuprofen (ADVIL,MOTRIN) 800 MG tablet Take 1 tablet (800 mg total) by mouth 3 (three) times daily. Patient not taking: Reported on 05/28/2017 06/27/16   Emi Holes, PA-C  ondansetron (ZOFRAN) 4 MG tablet Take 1 tablet (4 mg total) by mouth every 6 (six) hours. Patient not taking: Reported on 05/28/2017 06/27/16   Emi Holes, PA-C  oseltamivir (TAMIFLU) 75 MG capsule Take 1 capsule (75 mg total) by mouth every 12 (twelve) hours. Patient not taking: Reported on 05/28/2017 06/27/16   Emi Holes, PA-C    Family History No family history on file.  Social History Social History   Tobacco Use  . Smoking status: Never Smoker  . Smokeless tobacco: Never Used  Substance Use Topics  . Alcohol use: No  . Drug use: No     Allergies   Patient has no known allergies.   Review of Systems Review of Systems All other systems negative except as documented in the HPI. All pertinent positives and negatives  as reviewed in the HPI. Physical Exam Updated Vital Signs BP 115/78   Pulse 65   Temp 97.7 F (36.5 C) (Oral)   Resp 15   Ht 5\' 4"  (1.626 m)   Wt 86.2 kg (190 lb)   LMP 01/31/2017   SpO2 100%   BMI 32.61 kg/m   Physical Exam  Constitutional: She is oriented to person, place, and time. She appears well-developed and well-nourished. No distress.  HENT:  Head: Normocephalic and atraumatic.  Mouth/Throat: Oropharynx is clear and moist.    Eyes: Pupils are equal, round, and reactive to light.  Neck: Normal range of motion. Neck supple.  Cardiovascular: Normal rate, regular rhythm and normal heart sounds. Exam reveals no gallop and no friction rub.  No murmur heard. Pulmonary/Chest: Effort normal and breath sounds normal. No respiratory distress. She has no wheezes.  Abdominal: Soft. Bowel sounds are normal. She exhibits no distension. There is tenderness in the right lower quadrant and periumbilical area.  Neurological: She is alert and oriented to person, place, and time. She exhibits normal muscle tone. Coordination normal.  Skin: Skin is warm and dry. Capillary refill takes less than 2 seconds. No rash noted. No erythema.  Psychiatric: She has a normal mood and affect. Her behavior is normal.  Nursing note and vitals reviewed.    ED Treatments / Results  Labs (all labs ordered are listed, but only abnormal results are displayed) Labs Reviewed  URINALYSIS, ROUTINE W REFLEX MICROSCOPIC - Abnormal; Notable for the following components:      Result Value   Leukocytes, UA SMALL (*)    Bacteria, UA RARE (*)    Squamous Epithelial / LPF 0-5 (*)    All other components within normal limits  LIPASE, BLOOD  COMPREHENSIVE METABOLIC PANEL  CBC  I-STAT BETA HCG BLOOD, ED ([facility], [facility], [facility] ONLY)    EKG  EKG Interpretation None       Radiology Ct Abdomen Pelvis W Contrast  Result Date: 05/28/2017 CLINICAL DATA:  21 y/o F; abdominal cramping and burning sensation in the lower abdomen. EXAM: CT ABDOMEN AND PELVIS WITH CONTRAST TECHNIQUE: Multidetector CT imaging of the abdomen and pelvis was performed using the standard protocol following bolus administration of intravenous contrast. CONTRAST:  100mL ISOVUE-300 IOPAMIDOL (ISOVUE-300) INJECTION 61% COMPARISON:  None. FINDINGS: Lower chest: No acute abnormality. Hepatobiliary: No focal liver abnormality is seen. No gallstones, gallbladder wall thickening, or biliary dilatation.  Pancreas: Unremarkable. No pancreatic ductal dilatation or surrounding inflammatory changes. Spleen: Normal in size without focal abnormality. Adrenals/Urinary Tract: Adrenal glands are unremarkable. Kidneys are normal, without renal calculi, focal lesion, or hydronephrosis. Bladder is unremarkable. Stomach/Bowel: Stomach is within normal limits. Appendix appears normal. No evidence of bowel wall thickening, distention, or inflammatory changes. Vascular/Lymphatic: No significant vascular findings are present. No enlarged abdominal or pelvic lymph nodes. Reproductive: Uterus and bilateral adnexa are unremarkable. Other: No abdominal wall hernia or abnormality. No abdominopelvic ascites. Musculoskeletal: No acute or significant osseous findings. IMPRESSION: Normal CT of abdomen and pelvis. Electronically Signed   By: Mitzi HansenLance  Furusawa-Stratton M.D.   On: 05/28/2017 23:43    Procedures Procedures (including critical care time)  Medications Ordered in ED Medications  iopamidol (ISOVUE-300) 61 % injection (100 mLs  Contrast Given 05/28/17 2308)  sodium chloride 0.9 % bolus 1,000 mL (1,000 mLs Intravenous New Bag/Given 05/29/17 0023)  ketorolac (TORADOL) 30 MG/ML injection 30 mg (30 mg Intravenous Given 05/29/17 0023)     Initial Impression / Assessment and Plan / ED  Course  I have reviewed the triage vital signs and the nursing notes.  Pertinent labs & imaging results that were available during my care of the patient were reviewed by me and considered in my medical decision making (see chart for details).     Patient does not have any CT scan findings that show any acute abnormality.  The patient's vital signs remained stable she has been afebrile.  I feel that the patient does not have any signs of intra-abdominal infection at this time.  Patient would rather follow-up with GYN for any female examination.  The patient states that she has had no urinary symptoms.  The pain has been intermittent and it  is a crampy type pain which seems atypical for a PID. Final Clinical Impressions(s) / ED Diagnoses   Final diagnoses:  None    ED Discharge Orders    None       Charlestine NightLawyer, Cailin Gebel, PA-C 05/29/17 0101    Jacalyn LefevreHaviland, Julie, MD 05/29/17 1517

## 2017-06-15 IMAGING — CR DG FOOT COMPLETE 3+V*R*
3 series · 3 of 3 positions shown · non-contrast
Comparison: None.

CLINICAL DATA: Fall today.  Lateral right foot and ankle pain.

EXAM:
RIGHT FOOT COMPLETE - 3+ VIEW

[foot ap]
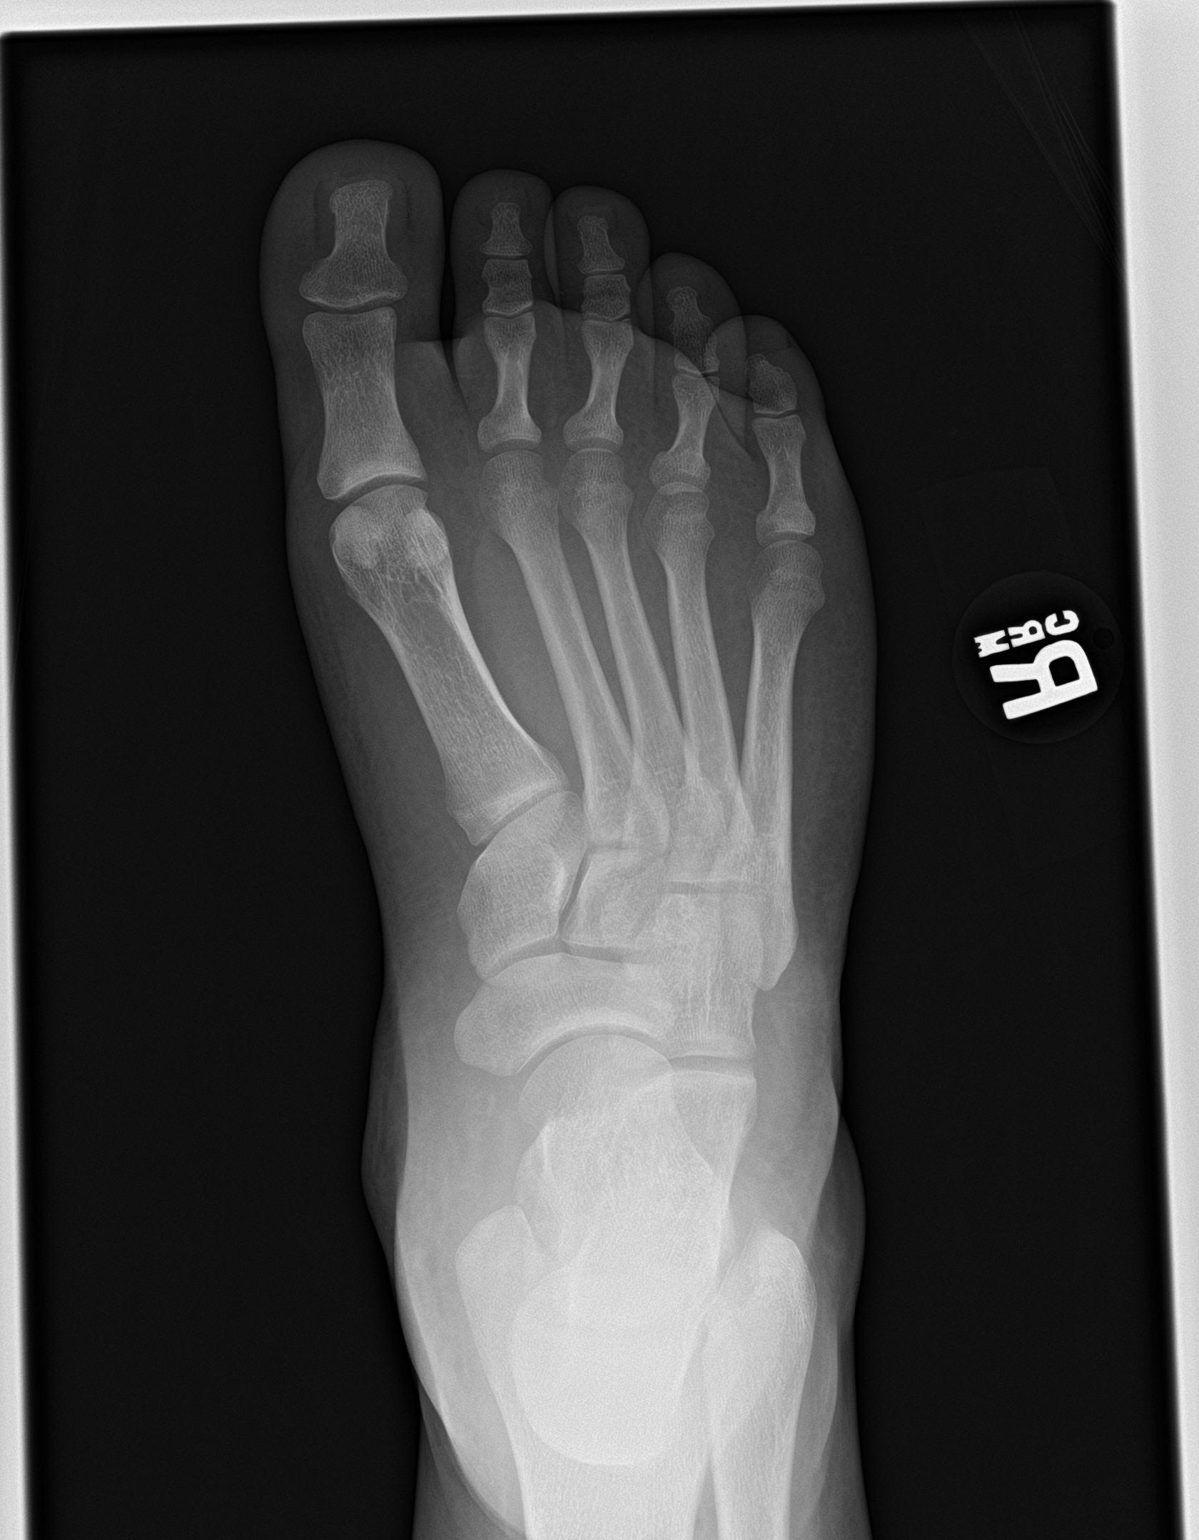

[foot obl]
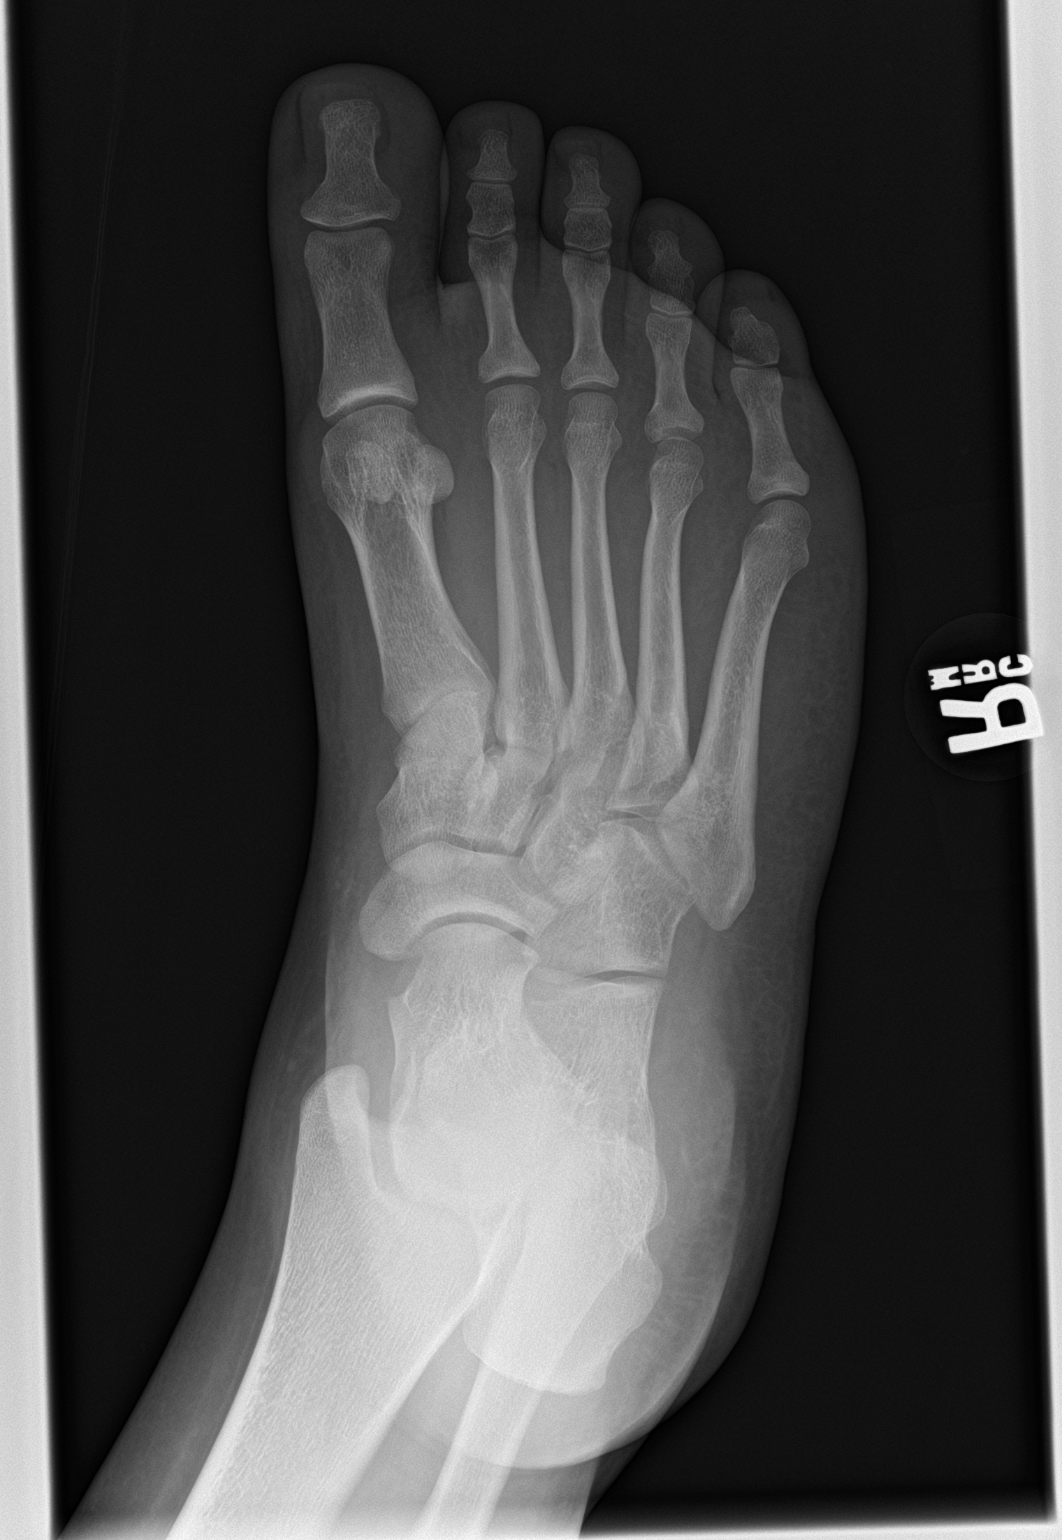

[foot lat]
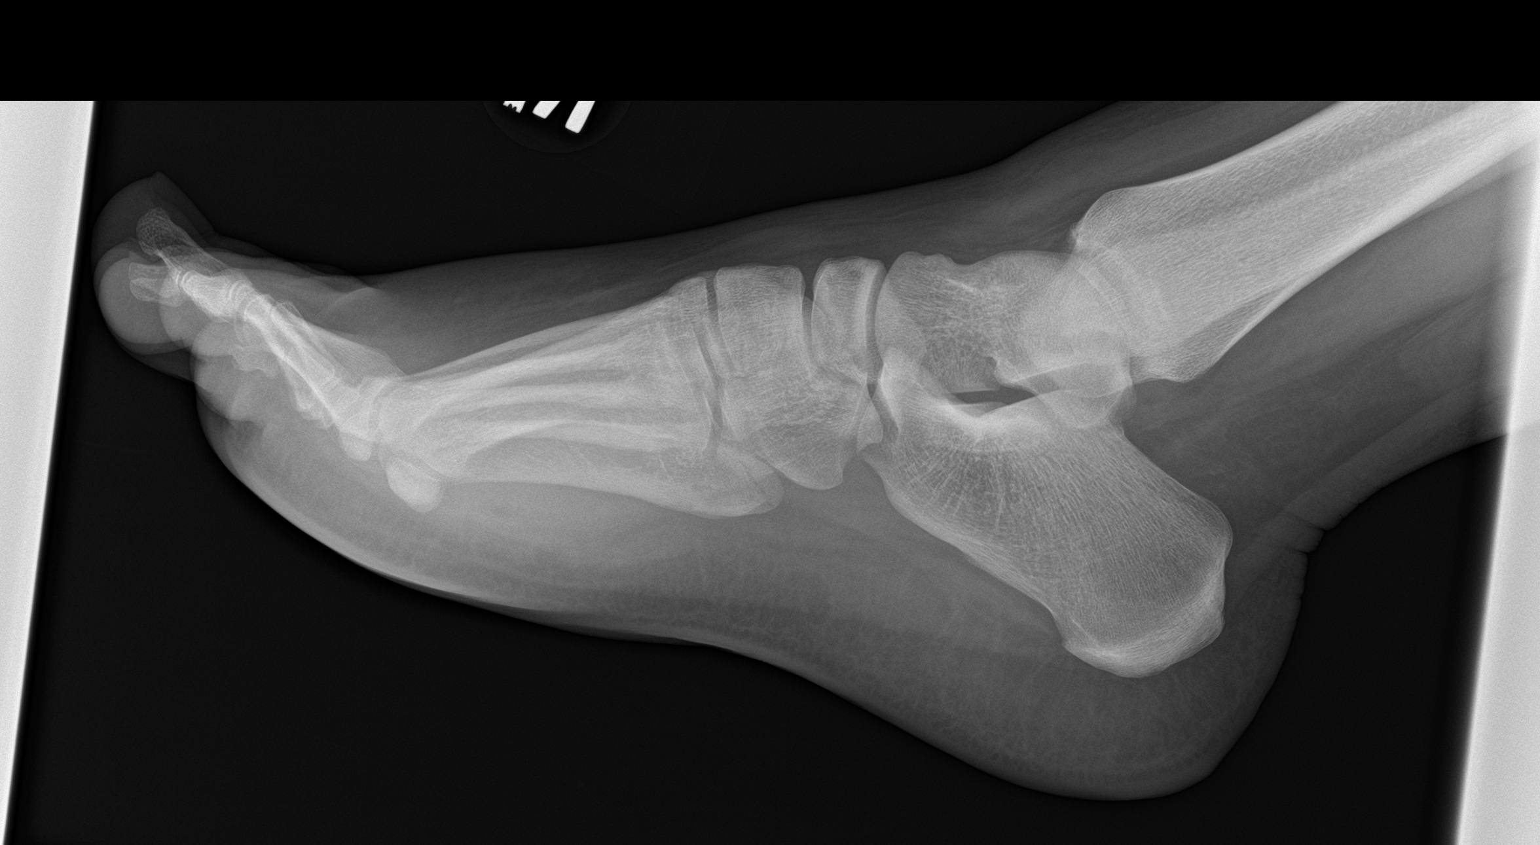

[3 of 3 positions shown; findings below may reference images not displayed]

FINDINGS: Nondisplaced intra-articular right base of fifth metatarsal fracture
with surrounding soft tissue swelling. No additional fracture. No
dislocation. No suspicious focal osseous lesion. No significant
arthropathy. No radiopaque foreign body.
IMPRESSION: Nondisplaced intra-articular right base of fifth metatarsal
fracture.

## 2017-08-11 ENCOUNTER — Encounter (HOSPITAL_COMMUNITY): Payer: Self-pay | Admitting: Emergency Medicine

## 2017-08-11 ENCOUNTER — Other Ambulatory Visit: Payer: Self-pay

## 2017-08-11 ENCOUNTER — Ambulatory Visit (HOSPITAL_COMMUNITY)
Admission: EM | Admit: 2017-08-11 | Discharge: 2017-08-11 | Disposition: A | Discharge status: Home / Self Care | Payer: Federal, State, Local not specified - PPO | Attending: Family Medicine | Admitting: Family Medicine

## 2017-08-11 DIAGNOSIS — J069 Acute upper respiratory infection, unspecified: Secondary | ICD-10-CM | POA: Diagnosis not present

## 2017-08-11 DIAGNOSIS — Z79899 Other long term (current) drug therapy: Secondary | ICD-10-CM | POA: Insufficient documentation

## 2017-08-11 DIAGNOSIS — B349 Viral infection, unspecified: Secondary | ICD-10-CM | POA: Insufficient documentation

## 2017-08-11 LAB — POCT RAPID STREP A: Streptococcus, Group A Screen (Direct): NEGATIVE

## 2017-08-11 MED ORDER — BENZONATATE 100 MG PO CAPS: 100.0000 mg | ORAL_CAPSULE | Freq: Three times a day (TID) | ORAL | 0 refills | Status: DC

## 2017-08-11 MED ORDER — IPRATROPIUM BROMIDE 0.06 % NA SOLN: 2.0000 | Freq: Four times a day (QID) | NASAL | 0 refills | Status: DC

## 2017-08-11 MED ORDER — AMOXICILLIN-POT CLAVULANATE 875-125 MG PO TABS: 1.0000 | ORAL_TABLET | Freq: Two times a day (BID) | ORAL | 0 refills | Status: DC

## 2017-08-11 MED ORDER — FLUTICASONE PROPIONATE 50 MCG/ACT NA SUSP: 2.0000 | Freq: Every day | NASAL | 0 refills | Status: DC

## 2017-08-11 MED ORDER — CETIRIZINE-PSEUDOEPHEDRINE ER 5-120 MG PO TB12: 1.0000 | ORAL_TABLET | Freq: Every day | ORAL | 0 refills | Status: DC

## 2017-08-11 NOTE — ED Provider Notes (Signed)
[facility]    CSN: [id number] Arrival date & time: 08/11/17  1441     History   Chief Complaint Chief Complaint  Patient presents with  . URI    HPI Tanya Hanson is a 22 y.o. female.   22 year old female comes in for 5 day history of URI symptoms.  States started out with sore throat, cold chills, body aches, fever of 101, with resolution of fever the next day and decreasing body aches and cold chills.  She states started taking OTC cold medication with some relief.  Woke up this morning with worsening symptoms, worsening sore throat, laryngitis, continued rhinorrhea, nasal congestion, productive cough.  Dysphagia without swelling of the throat, shortness of breath, drooling, tripoding.  Started on some decongestants with some relief.  Never smoker.      History reviewed. No pertinent past medical history.  There are no active problems to display for this patient.   History reviewed. No pertinent surgical history.  OB History    No data available       Home Medications    Prior to Admission medications   Medication Sig Start Date End Date Taking? Authorizing Provider  amoxicillin-clavulanate (AUGMENTIN) 875-125 MG tablet Take 1 tablet by mouth every 12 (twelve) hours. Fill on 3/17 if symptoms not improving. 08/16/17   Cathie HoopsYu, Ridgely Anastacio V, PA-C  benzonatate (TESSALON) 100 MG capsule Take 1 capsule (100 mg total) by mouth every 8 (eight) hours. 08/11/17   Cathie HoopsYu, Rhen Dossantos V, PA-C  cetirizine-pseudoephedrine (ZYRTEC-D) 5-120 MG tablet Take 1 tablet by mouth daily. 08/11/17   Cathie HoopsYu, Kimika Streater V, PA-C  fluticasone (FLONASE) 50 MCG/ACT nasal spray Place 2 sprays into both nostrils daily. 08/11/17   Belinda FisherYu, Carrington Mullenax V, PA-C  HYDROcodone-acetaminophen (NORCO) 5-325 MG tablet Take 1 tablet by mouth every 6 (six) hours as needed. Patient not taking: Reported on 05/28/2017 11/05/16   Janne NapoleonNeese, Hope M, NP  ibuprofen (ADVIL,MOTRIN) 800 MG tablet Take 1 tablet (800 mg total) by mouth every 8 (eight) hours as  needed. 05/29/17   Lawyer, Cristal Deerhristopher, PA-C  ipratropium (ATROVENT) 0.06 % nasal spray Place 2 sprays into both nostrils 4 (four) times daily. 08/11/17   Cathie HoopsYu, Michi Herrmann V, PA-C  ondansetron (ZOFRAN) 4 MG tablet Take 1 tablet (4 mg total) by mouth every 6 (six) hours. Patient not taking: Reported on 05/28/2017 06/27/16   Emi HolesLaw, Alexandra M, PA-C    Family History History reviewed. No pertinent family history.  Social History Social History   Tobacco Use  . Smoking status: Never Smoker  . Smokeless tobacco: Never Used  Substance Use Topics  . Alcohol use: No  . Drug use: No     Allergies   Patient has no known allergies.   Review of Systems Review of Systems  Reason unable to perform ROS: See HPI as above.     Physical Exam Triage Vital Signs ED Triage Vitals  Enc Vitals Group     BP 08/11/17 1626 113/69     Pulse Rate 08/11/17 1626 61     Resp 08/11/17 1626 (!) 24     Temp 08/11/17 1626 98 F (36.7 C)     Temp Source 08/11/17 1626 Oral     SpO2 08/11/17 1626 100 %     Weight --      Height --      Head Circumference --      Peak Flow --      Pain Score 08/11/17 1623 9  Pain Loc --      Pain Edu? --      Excl. in GC? --    No data found.  Updated Vital Signs BP 113/69 (BP Location: Left Arm) Comment (BP Location): large cuff  Pulse 61   Temp 98 F (36.7 C) (Oral)   Resp (!) 24   SpO2 100%   Physical Exam  Constitutional: She is oriented to person, place, and time. She appears well-developed and well-nourished. No distress.  HENT:  Head: Normocephalic and atraumatic.  Right Ear: External ear and ear canal normal. Tympanic membrane is erythematous. Tympanic membrane is not bulging.  Left Ear: External ear and ear canal normal. Tympanic membrane is erythematous. Tympanic membrane is not bulging.  Nose: Mucosal edema and rhinorrhea present. No epistaxis. Right sinus exhibits no maxillary sinus tenderness and no frontal sinus tenderness. Left sinus exhibits no  maxillary sinus tenderness and no frontal sinus tenderness.  Mouth/Throat: Uvula is midline and mucous membranes are normal. Posterior oropharyngeal erythema present. No tonsillar exudate.  Eyes: Conjunctivae are normal. Pupils are equal, round, and reactive to light.  Neck: Normal range of motion. Neck supple.  Cardiovascular: Normal rate, regular rhythm and normal heart sounds. Exam reveals no gallop and no friction rub.  No murmur heard. Pulmonary/Chest: Effort normal and breath sounds normal. She has no decreased breath sounds. She has no wheezes. She has no rhonchi. She has no rales.  Lymphadenopathy:    She has no cervical adenopathy.  Neurological: She is alert and oriented to person, place, and time.  Skin: Skin is warm and dry.  Psychiatric: She has a normal mood and affect. Her behavior is normal. Judgment normal.     UC Treatments / Results  Labs (all labs ordered are listed, but only abnormal results are displayed) Labs Reviewed  CULTURE, GROUP A STREP [facility])  POCT RAPID STREP A    EKG  EKG Interpretation None       Radiology No results found.  Procedures Procedures (including critical care time)  Medications Ordered in UC Medications - No data to display   Initial Impression / Assessment and Plan / UC Course  I have reviewed the triage vital signs and the nursing notes.  Pertinent labs & imaging results that were available during my care of the patient were reviewed by me and considered in my medical decision making (see chart for details).    Rapid strep negative. Symptomatic treatment as needed.  Push fluids.  Rx of Augmentin sent to pharmacy, patient can fill in 5 days if symptoms not improving for sinusitis.  Return precautions given.    Final Clinical Impressions(s) / UC Diagnoses   Final diagnoses:  Viral syndrome    ED Discharge Orders        Ordered    amoxicillin-clavulanate (AUGMENTIN) 875-125 MG tablet  Every 12 hours     08/11/17 1749     fluticasone (FLONASE) 50 MCG/ACT nasal spray  Daily     08/11/17 1749    ipratropium (ATROVENT) 0.06 % nasal spray  4 times daily     08/11/17 1749    cetirizine-pseudoephedrine (ZYRTEC-D) 5-120 MG tablet  Daily     08/11/17 1749    benzonatate (TESSALON) 100 MG capsule  Every 8 hours     08/11/17 1749        Belinda Fisher, PA-C 08/11/17 1754

## 2017-08-11 NOTE — Discharge Instructions (Signed)
Rapid strep negative. Tessalon for cough. Start flonase, atrovent nasal spray, zyrtec-D for nasal congestion/drainage. You can use over the counter nasal saline rinse such as neti pot for nasal congestion. Keep hydrated, your urine should be clear to pale yellow in color. Tylenol/motrin for fever and pain. Monitor for any worsening of symptoms, chest pain, shortness of breath, wheezing, swelling of the throat, follow up for reevaluation.   If symptoms not improving by 3/17, can fill Augmentin for sinus infection.   For sore throat try using a honey-based tea. Use 3 teaspoons of honey with juice squeezed from half lemon. Place shaved pieces of ginger into 1/2-1 cup of water and warm over stove top. Then mix the ingredients and repeat every 4 hours as needed.

## 2017-08-11 NOTE — ED Triage Notes (Signed)
Woke Thursday with symptoms.  Initially had sore throat, then cold chills and body aches.  Continues sore throat, laryngitis, blood tinged secretions from nose.

## 2017-08-14 LAB — CULTURE, GROUP A STREP (THRC)

## 2017-11-02 ENCOUNTER — Other Ambulatory Visit: Payer: Self-pay

## 2017-11-02 ENCOUNTER — Emergency Department (HOSPITAL_COMMUNITY)
Admission: EM | Admit: 2017-11-02 | Discharge: 2017-11-03 | Disposition: A | Discharge status: Home / Self Care | Payer: Federal, State, Local not specified - PPO | Attending: Emergency Medicine | Admitting: Emergency Medicine

## 2017-11-02 DIAGNOSIS — R51 Headache: Secondary | ICD-10-CM | POA: Diagnosis not present

## 2017-11-02 DIAGNOSIS — Z79899 Other long term (current) drug therapy: Secondary | ICD-10-CM | POA: Diagnosis not present

## 2017-11-02 DIAGNOSIS — R519 Headache, unspecified: Secondary | ICD-10-CM

## 2017-11-02 NOTE — ED Triage Notes (Signed)
Patient c/o HA x 2 weeks. States that it won't go away - hasn't had a HA like this since 2017

## 2017-11-03 MED ORDER — KETOROLAC TROMETHAMINE 30 MG/ML IJ SOLN
30.0000 mg | Freq: Once | INTRAMUSCULAR | Status: AC
  Administered 2017-11-03: 30 mg via INTRAVENOUS
  Filled 2017-11-03: qty 1

## 2017-11-03 MED ORDER — DIPHENHYDRAMINE HCL 50 MG/ML IJ SOLN
25.0000 mg | Freq: Once | INTRAMUSCULAR | Status: AC
  Administered 2017-11-03: 25 mg via INTRAVENOUS
  Filled 2017-11-03: qty 1

## 2017-11-03 MED ORDER — PROCHLORPERAZINE EDISYLATE 10 MG/2ML IJ SOLN
10.0000 mg | Freq: Once | INTRAMUSCULAR | Status: AC
  Administered 2017-11-03: 10 mg via INTRAVENOUS
  Filled 2017-11-03: qty 2

## 2017-11-03 NOTE — Discharge Instructions (Addendum)
You were seen today for headache.  You were treated for migraine.  Your symptoms resolved.  If you continue to have persistent headaches, you should follow-up with neurology.  If you do not have a primary physician follow-up with [facility].

## 2017-11-03 NOTE — ED Provider Notes (Signed)
[facility] EMERGENCY DEPARTMENT Provider Note   CSN: [id number] Arrival date & time: 11/02/17  2229     History   Chief Complaint Chief Complaint  Patient presents with  . Headache    HPI Tanya Hanson is a 22 y.o. female.  HPI  This is a 22 year old female who presents with headache.  Patient reports daily headache for the last 2 weeks.  Patient reports the headache is frontal.  She takes ibuprofen daily and it has helped except for today.  She reports photophobia.  No nausea or vomiting.  Denies vision changes, weakness, numbness, tingling, speech difficulty.  Patient does report similar headaches in the past but no documented history of migraines.  She rates her pain today at 10 out of 10.  She denies any fevers or neck stiffness.  No past medical history on file.  There are no active problems to display for this patient.   No past surgical history on file.   OB History   None      Home Medications    Prior to Admission medications   Medication Sig Start Date End Date Taking? Authorizing Provider  fluticasone (FLONASE) 50 MCG/ACT nasal spray Place 2 sprays into both nostrils daily. 08/11/17  Yes Yu, Amy V, PA-C  ibuprofen (ADVIL,MOTRIN) 200 MG tablet Take 200 mg by mouth every 6 (six) hours as needed for headache.   Yes [provider]  amoxicillin-clavulanate (AUGMENTIN) 875-125 MG tablet Take 1 tablet by mouth every 12 (twelve) hours. Fill on 3/17 if symptoms not improving. Patient not taking: Reported on 11/03/2017 08/16/17   Belinda Fisher, PA-C  benzonatate (TESSALON) 100 MG capsule Take 1 capsule (100 mg total) by mouth every 8 (eight) hours. Patient not taking: Reported on 11/03/2017 08/11/17   Belinda Fisher, PA-C  cetirizine-pseudoephedrine (ZYRTEC-D) 5-120 MG tablet Take 1 tablet by mouth daily. Patient not taking: Reported on 11/03/2017 08/11/17   Belinda Fisher, PA-C  HYDROcodone-acetaminophen (NORCO) 5-325 MG tablet Take 1 tablet by mouth every 6  (six) hours as needed. Patient not taking: Reported on 05/28/2017 11/05/16   Janne Napoleon, NP  ibuprofen (ADVIL,MOTRIN) 800 MG tablet Take 1 tablet (800 mg total) by mouth every 8 (eight) hours as needed. Patient not taking: Reported on 11/03/2017 05/29/17   Charlestine Night, PA-C  ipratropium (ATROVENT) 0.06 % nasal spray Place 2 sprays into both nostrils 4 (four) times daily. Patient not taking: Reported on 11/03/2017 08/11/17   Belinda Fisher, PA-C  ondansetron (ZOFRAN) 4 MG tablet Take 1 tablet (4 mg total) by mouth every 6 (six) hours. Patient not taking: Reported on 05/28/2017 06/27/16   Emi Holes, PA-C    Family History No family history on file.  Social History Social History   Tobacco Use  . Smoking status: Never Smoker  . Smokeless tobacco: Never Used  Substance Use Topics  . Alcohol use: No  . Drug use: No     Allergies   Patient has no known allergies.   Review of Systems Review of Systems  Constitutional: Negative for fever.  Eyes: Positive for photophobia. Negative for visual disturbance.  Respiratory: Negative for shortness of breath.   Cardiovascular: Negative for chest pain.  Gastrointestinal: Negative for abdominal pain, nausea and vomiting.  Neurological: Positive for light-headedness and headaches. Negative for dizziness and weakness.  All other systems reviewed and are negative.    Physical Exam Updated Vital Signs BP 115/86 (BP Location: Right Arm)   Pulse  62   Temp 98.9 F (37.2 C) (Oral)   Resp 20   Ht 5' 4.5" (1.638 m)   Wt 90.7 kg (200 lb)   LMP 07/05/2017 (Approximate)   SpO2 100%   BMI 33.80 kg/m   Physical Exam  Constitutional: She is oriented to person, place, and time. She appears well-developed and well-nourished.  Morbidly obese, no acute distress  HENT:  Head: Normocephalic and atraumatic.  Eyes: Pupils are equal, round, and reactive to light. EOM are normal.  Neck: Normal range of motion. Neck supple.  No meningismus    Cardiovascular: Normal rate, regular rhythm and normal heart sounds.  Pulmonary/Chest: Effort normal. No respiratory distress. She has no wheezes.  Abdominal: Soft. Bowel sounds are normal.  Neurological: She is alert and oriented to person, place, and time.  Cranial nerves II through XII intact, fluent speech, 5 out of 5 strength in all 4 extremities, no dysmetria to finger-nose-finger  Skin: Skin is warm and dry.  Psychiatric: She has a normal mood and affect.  Nursing note and vitals reviewed.    ED Treatments / Results  Labs (all labs ordered are listed, but only abnormal results are displayed) Labs Reviewed - No data to display  EKG None  Radiology No results found.  Procedures Procedures (including critical care time)  Medications Ordered in ED Medications  ketorolac (TORADOL) 30 MG/ML injection 30 mg (30 mg Intravenous Given 11/03/17 0537)  prochlorperazine (COMPAZINE) injection 10 mg (10 mg Intravenous Given 11/03/17 0541)  diphenhydrAMINE (BENADRYL) injection 25 mg (25 mg Intravenous Given 11/03/17 0540)     Initial Impression / Assessment and Plan / ED Course  I have reviewed the triage vital signs and the nursing notes.  Pertinent labs & imaging results that were available during my care of the patient were reviewed by me and considered in my medical decision making (see chart for details).  Clinical Course as of Nov 03 608  Tue Nov 03, 2017  0610 Patient symptoms completely resolved with migraine cocktail.  She remains neurologically intact.   [CH]    Clinical Course User Index [CH] Tanya Hanson, Mayer Maskerourtney F, MD    Patient presents with headache.  Reports daily headache for the last 2 weeks.  Does have remote history of headaches that have been similar.  She is overall nontoxic.  No focal deficits.  Doubt subarachnoid hemorrhage or meningitis.  She denies any vision changes.  She is overweight but I have low suspicion at this time for idiopathic intracranial  hypertension.  Patient was given a migraine cocktail.  She had almost immediate relief of her headache.  Suspect she may have undiagnosed migraines.  Recommend neurology follow-up.  After history, exam, and medical workup I feel the patient has been appropriately medically screened and is safe for discharge home. Pertinent diagnoses were discussed with the patient. Patient was given return precautions.   Final Clinical Impressions(s) / ED Diagnoses   Final diagnoses:  Bad headache    ED Discharge Orders    None       Shon BatonHorton, Donabelle Molden F, MD 11/03/17 [phone]

## 2018-04-14 ENCOUNTER — Encounter (HOSPITAL_COMMUNITY): Payer: Self-pay | Admitting: Emergency Medicine

## 2018-04-14 ENCOUNTER — Emergency Department (HOSPITAL_COMMUNITY)
Admission: EM | Admit: 2018-04-14 | Discharge: 2018-04-14 | Disposition: A | Discharge status: Home / Self Care | Payer: Federal, State, Local not specified - PPO | Attending: Emergency Medicine | Admitting: Emergency Medicine

## 2018-04-14 DIAGNOSIS — J029 Acute pharyngitis, unspecified: Secondary | ICD-10-CM | POA: Diagnosis not present

## 2018-04-14 DIAGNOSIS — Z79899 Other long term (current) drug therapy: Secondary | ICD-10-CM | POA: Diagnosis not present

## 2018-04-14 DIAGNOSIS — J020 Streptococcal pharyngitis: Secondary | ICD-10-CM | POA: Diagnosis not present

## 2018-04-14 LAB — GROUP A STREP BY PCR: Group A Strep by PCR: DETECTED — AB

## 2018-04-14 MED ORDER — PENICILLIN G BENZATHINE 1200000 UNIT/2ML IM SUSP
1.2000 10*6.[IU] | Freq: Once | INTRAMUSCULAR | Status: AC
  Administered 2018-04-14: 1.2 10*6.[IU] via INTRAMUSCULAR
  Filled 2018-04-14: qty 2

## 2018-04-14 NOTE — ED Notes (Signed)
Patient verbalizes understanding of discharge instructions. Opportunity for questioning and answers were provided. Armband removed by staff, pt discharged from ED ambulatory.   

## 2018-04-14 NOTE — Discharge Instructions (Addendum)
Recommend salt water gargles, fluids, Tylenol and/or ibuprofen for comfort until symptoms resolve.

## 2018-04-14 NOTE — ED Provider Notes (Signed)
[name]ENC[facility] Provider Note   CSN: [id number] Arrival date & time: 04/14/18  1959     History   Chief Complaint Chief Complaint  Patient presents with  . Sore Throat    HPI Tanya Hanson is a 22 y.o. female.  Patient without significant medical history presents for evaluation of sore throat that started 1-2 days ago. No fever, congestion, cough, nausea, rash or headache. She states her little brother complained of a sore throat earlier in the week but tested negative for strep at his pediatrician's office.   The history is provided by the patient. No language interpreter was used.    History reviewed. No pertinent past medical history.  There are no active problems to display for this patient.   History reviewed. No pertinent surgical history.   OB History   None      Home Medications    Prior to Admission medications   Medication Sig Start Date End Date Taking? Authorizing Provider  amoxicillin-clavulanate (AUGMENTIN) 875-125 MG tablet Take 1 tablet by mouth every 12 (twelve) hours. Fill on 3/17 if symptoms not improving. Patient not taking: Reported on 11/03/2017 08/16/17   Belinda FisherYu, Amy V, PA-C  benzonatate (TESSALON) 100 MG capsule Take 1 capsule (100 mg total) by mouth every 8 (eight) hours. Patient not taking: Reported on 11/03/2017 08/11/17   Belinda FisherYu, Amy V, PA-C  cetirizine-pseudoephedrine (ZYRTEC-D) 5-120 MG tablet Take 1 tablet by mouth daily. Patient not taking: Reported on 11/03/2017 08/11/17   Belinda FisherYu, Amy V, PA-C  fluticasone [facility]FLONASE) 50 MCG/ACT nasal spray Place 2 sprays into both nostrils daily. 08/11/17   Belinda FisherYu, Amy V, PA-C  HYDROcodone-acetaminophen (NORCO) 5-325 MG tablet Take 1 tablet by mouth every 6 (six) hours as needed. Patient not taking: Reported on 05/28/2017 11/05/16   Janne NapoleonNeese, Hope M, NP  ibuprofen (ADVIL,MOTRIN) 200 MG tablet Take 200 mg by mouth every 6 (six) hours as needed for headache.    [provider]    ibuprofen (ADVIL,MOTRIN) 800 MG tablet Take 1 tablet (800 mg total) by mouth every 8 (eight) hours as needed. Patient not taking: Reported on 11/03/2017 05/29/17   Charlestine NightLawyer, Christopher, PA-C  ipratropium (ATROVENT) 0.06 % nasal spray Place 2 sprays into both nostrils 4 (four) times daily. Patient not taking: Reported on 11/03/2017 08/11/17   Belinda FisherYu, Amy V, PA-C  ondansetron (ZOFRAN) 4 MG tablet Take 1 tablet (4 mg total) by mouth every 6 (six) hours. Patient not taking: Reported on 05/28/2017 06/27/16   Emi HolesLaw, Alexandra M, PA-C    Family History No family history on file.  Social History Social History   Tobacco Use  . Smoking status: Never Smoker  . Smokeless tobacco: Never Used  Substance Use Topics  . Alcohol use: No  . Drug use: No     Allergies   Patient has no known allergies.   Review of Systems Review of Systems  Constitutional: Negative for chills and fever.  HENT: Positive for sore throat. Negative for congestion and trouble swallowing.   Respiratory: Negative.  Negative for cough.   Cardiovascular: Negative.   Gastrointestinal: Negative.  Negative for nausea.  Musculoskeletal: Negative.  Negative for myalgias.  Skin: Negative.  Negative for rash.  Neurological: Negative.  Negative for headaches.     Physical Exam Updated Vital Signs BP 123/74 (BP Location: Right Arm)   Pulse 66   Temp 98.1 F (36.7 C) (Oral)   Resp 14   SpO2 96%   Physical Exam  Constitutional: She is oriented to person, place, and time. She appears well-developed and well-nourished.  HENT:  Head: Normocephalic.  Mouth/Throat: Mucous membranes are normal. Posterior oropharyngeal erythema present.  Mild bilateral tonsillar swelling without exudates.   Neck: Normal range of motion.  Pulmonary/Chest: Effort normal.  Neurological: She is alert and oriented to person, place, and time.  Skin: Skin is warm and dry.  Nursing note and vitals reviewed.    ED Treatments / Results  Labs (all labs  ordered are listed, but only abnormal results are displayed) Labs Reviewed  GROUP A STREP BY PCR - Abnormal; Notable for the following components:      Result Value   Group A Strep by PCR DETECTED (*)    All other components within normal limits    EKG None  Radiology No results found.  Procedures Procedures (including critical care time)  Medications Ordered in ED Medications - No data to display   Initial Impression / Assessment and Plan / ED Course  I have reviewed the triage vital signs and the nursing notes.  Pertinent labs & imaging results that were available during my care of the patient were reviewed by me and considered in my medical decision making (see chart for details).     Patient here for evaluation of sore throat. History of strep that she states feels the same.  She is found to be positive for strep here. IM Bicillin provided. Other supportive measures discussed.  Final Clinical Impressions(s) / ED Diagnoses   Final diagnoses:  None   1. Strep throat  ED Discharge Orders    None       Elpidio Anis, PA-C 04/14/18 2253    Melene Plan, DO 04/14/18 2307

## 2018-04-14 NOTE — ED Triage Notes (Signed)
Pt having pain in her throat. Hurts to swallow. Pt stated she has been around a family member with a sore throat who tested negative for strept.

## 2018-08-02 ENCOUNTER — Encounter: Payer: Self-pay | Admitting: Family Medicine

## 2018-08-02 ENCOUNTER — Other Ambulatory Visit: Payer: Self-pay | Admitting: Family Medicine

## 2018-08-02 ENCOUNTER — Ambulatory Visit (INDEPENDENT_AMBULATORY_CARE_PROVIDER_SITE_OTHER): Payer: Federal, State, Local not specified - PPO | Admitting: Family Medicine

## 2018-08-02 VITALS — BP 140/90 | HR 92 | Temp 98.0°F | Ht 64.5 in | Wt 260.0 lb

## 2018-08-02 DIAGNOSIS — R8290 Unspecified abnormal findings in urine: Secondary | ICD-10-CM | POA: Diagnosis not present

## 2018-08-02 DIAGNOSIS — Z7689 Persons encountering health services in other specified circumstances: Secondary | ICD-10-CM

## 2018-08-02 DIAGNOSIS — E282 Polycystic ovarian syndrome: Secondary | ICD-10-CM | POA: Diagnosis not present

## 2018-08-02 DIAGNOSIS — F411 Generalized anxiety disorder: Secondary | ICD-10-CM | POA: Insufficient documentation

## 2018-08-02 DIAGNOSIS — L680 Hirsutism: Secondary | ICD-10-CM | POA: Diagnosis not present

## 2018-08-02 DIAGNOSIS — Z0000 Encounter for general adult medical examination without abnormal findings: Secondary | ICD-10-CM | POA: Diagnosis not present

## 2018-08-02 DIAGNOSIS — N926 Irregular menstruation, unspecified: Secondary | ICD-10-CM | POA: Insufficient documentation

## 2018-08-02 DIAGNOSIS — Z09 Encounter for follow-up examination after completed treatment for conditions other than malignant neoplasm: Secondary | ICD-10-CM

## 2018-08-02 DIAGNOSIS — Z131 Encounter for screening for diabetes mellitus: Secondary | ICD-10-CM | POA: Diagnosis not present

## 2018-08-02 DIAGNOSIS — Z23 Encounter for immunization: Secondary | ICD-10-CM

## 2018-08-02 DIAGNOSIS — N390 Urinary tract infection, site not specified: Secondary | ICD-10-CM

## 2018-08-02 DIAGNOSIS — E66813 Obesity, class 3: Secondary | ICD-10-CM | POA: Insufficient documentation

## 2018-08-02 DIAGNOSIS — Z6841 Body Mass Index (BMI) 40.0 and over, adult: Secondary | ICD-10-CM

## 2018-08-02 LAB — POCT GLYCOSYLATED HEMOGLOBIN (HGB A1C): Hemoglobin A1C: 5 % (ref 4.0–5.6)

## 2018-08-02 LAB — POCT URINALYSIS DIP (MANUAL ENTRY)
Blood, UA: NEGATIVE
Glucose, UA: NEGATIVE mg/dL
Nitrite, UA: NEGATIVE
Protein Ur, POC: 30 mg/dL — AB
Spec Grav, UA: 1.025 (ref 1.010–1.025)
Urobilinogen, UA: 0.2 E.U./dL
pH, UA: 6 (ref 5.0–8.0)

## 2018-08-02 MED ORDER — SULFAMETHOXAZOLE-TRIMETHOPRIM 800-160 MG PO TABS: 1.0000 | ORAL_TABLET | Freq: Two times a day (BID) | ORAL | 0 refills | Status: AC

## 2018-08-02 NOTE — Patient Instructions (Addendum)
Migraine Headache  A migraine headache is a very strong throbbing pain on one side or both sides of your head. Migraines can also cause other symptoms. Talk with your doctor about what things may bring on (trigger) your migraine headaches. Follow these instructions at home: Medicines  Take over-the-counter and prescription medicines only as told by your doctor.  Do not drive or use heavy machinery while taking prescription pain medicine.  To prevent or treat constipation while you are taking prescription pain medicine, your doctor may recommend that you: ? Drink enough fluid to keep your pee (urine) clear or pale yellow. ? Take over-the-counter or prescription medicines. ? Eat foods that are high in fiber. These include fresh fruits and vegetables, whole grains, and beans. ? Limit foods that are high in fat and processed sugars. These include fried and sweet foods. Lifestyle  Avoid alcohol.  Do not use any products that contain nicotine or tobacco, such as cigarettes and e-cigarettes. If you need help quitting, ask your doctor.  Get at least 8 hours of sleep every night.  Limit your stress. General instructions   Keep a journal to find out what may bring on your migraines. For example, write down: ? What you eat and drink. ? How much sleep you get. ? Any change in what you eat or drink. ? Any change in your medicines.  If you have a migraine: ? Avoid things that make your symptoms worse, such as bright lights. ? It may help to lie down in a dark, quiet room. ? Do not drive or use heavy machinery. ? Ask your doctor what activities are safe for you.  Keep all follow-up visits as told by your doctor. This is important. Contact a doctor if:  You get a migraine that is different or worse than your usual migraines. Get help right away if:  Your migraine gets very bad.  You have a fever.  You have a stiff neck.  You have trouble seeing.  Your muscles feel weak or like  you cannot control them.  You start to lose your balance a lot.  You start to have trouble walking.  You pass out (faint). This information is not intended to replace advice given to you by your health care provider. Make sure you discuss any questions you have with your health care provider. Document Released: 02/26/2008 Document Revised: 02/10/2018 Document Reviewed: 11/05/2015 Elsevier Interactive Patient Education  2019 [employer]   Generalized Anxiety Disorder, Adult Generalized anxiety disorder (GAD) is a mental health disorder. People with this condition constantly worry about everyday events. Unlike normal anxiety, worry related to GAD is not triggered by a specific event. These worries also do not fade or get better with time. GAD interferes with life functions, including relationships, work, and school. GAD can vary from mild to severe. People with severe GAD can have intense waves of anxiety with physical symptoms (panic attacks). What are the causes? The exact cause of GAD is not known. What increases the risk? This condition is more likely to develop in:  Women.  People who have a family history of anxiety disorders.  People who are very shy.  People who experience very stressful life events, such as the death of a loved one.  People who have a very stressful family environment. What are the signs or symptoms? People with GAD often worry excessively about many things in their lives, such as their health and family. They may also be overly concerned about:  Doing  well at work.  Being on time.  Natural disasters.  Friendships. Physical symptoms of GAD include:  Fatigue.  Muscle tension or having muscle twitches.  Trembling or feeling shaky.  Being easily startled.  Feeling like your heart is pounding or racing.  Feeling out of breath or like you cannot take a deep breath.  Having trouble falling asleep or staying asleep.  Sweating.  Nausea,  diarrhea, or irritable bowel syndrome (IBS).  Headaches.  Trouble concentrating or remembering facts.  Restlessness.  Irritability. How is this diagnosed? Your health care provider can diagnose GAD based on your symptoms and medical history. You will also have a physical exam. The health care provider will ask specific questions about your symptoms, including how severe they are, when they started, and if they come and go. Your health care provider may ask you about your use of alcohol or drugs, including prescription medicines. Your health care provider may refer you to a mental health specialist for further evaluation. Your health care provider will do a thorough examination and may perform additional tests to rule out other possible causes of your symptoms. To be diagnosed with GAD, a person must have anxiety that:  Is out of his or her control.  Affects several different aspects of his or her life, such as work and relationships.  Causes distress that makes him or her unable to take part in normal activities.  Includes at least three physical symptoms of GAD, such as restlessness, fatigue, trouble concentrating, irritability, muscle tension, or sleep problems. Before your health care provider can confirm a diagnosis of GAD, these symptoms must be present more days than they are not, and they must last for six months or longer. How is this treated? The following therapies are usually used to treat GAD:  Medicine. Antidepressant medicine is usually prescribed for long-term daily control. Antianxiety medicines may be added in severe cases, especially when panic attacks occur.  Talk therapy (psychotherapy). Certain types of talk therapy can be helpful in treating GAD by providing support, education, and guidance. Options include: ? Cognitive behavioral therapy (CBT). People learn coping skills and techniques to ease their anxiety. They learn to identify unrealistic or negative thoughts  and behaviors and to replace them with positive ones. ? Acceptance and commitment therapy (ACT). This treatment teaches people how to be mindful as a way to cope with unwanted thoughts and feelings. ? Biofeedback. This process trains you to manage your body's response (physiological response) through breathing techniques and relaxation methods. You will work with a therapist while machines are used to monitor your physical symptoms.  Stress management techniques. These include yoga, meditation, and exercise. A mental health specialist can help determine which treatment is best for you. Some people see improvement with one type of therapy. However, other people require a combination of therapies. Follow these instructions at home:  Take over-the-counter and prescription medicines only as told by your health care provider.  Try to maintain a normal routine.  Try to anticipate stressful situations and allow extra time to manage them.  Practice any stress management or self-calming techniques as taught by your health care provider.  Do not punish yourself for setbacks or for not making progress.  Try to recognize your accomplishments, even if they are small.  Keep all follow-up visits as told by your health care provider. This is important. Contact a health care provider if:  Your symptoms do not get better.  Your symptoms get worse.  You have signs of  depression, such as: ? A persistently sad, cranky, or irritable mood. ? Loss of enjoyment in activities that used to bring you joy. ? Change in weight or eating. ? Changes in sleeping habits. ? Avoiding friends or family members. ? Loss of energy for normal tasks. ? Feelings of guilt or worthlessness. Get help right away if:  You have serious thoughts about hurting yourself or others. If you ever feel like you may hurt yourself or others, or have thoughts about taking your own life, get help right away. You can go to your nearest  emergency department or call:  Your local emergency services (911 in the [place]  A suicide crisis helpline, such as the National Suicide Prevention Lifeline at 640 426 5000. This is open 24 hours a day. Summary  Generalized anxiety disorder (GAD) is a mental health disorder that involves worry that is not triggered by a specific event.  People with GAD often worry excessively about many things in their lives, such as their health and family.  GAD may cause physical symptoms such as restlessness, trouble concentrating, sleep problems, frequent sweating, nausea, diarrhea, headaches, and trembling or muscle twitching.  A mental health specialist can help determine which treatment is best for you. Some people see improvement with one type of therapy. However, other people require a combination of therapies. This information is not intended to replace advice given to you by your health care provider. Make sure you discuss any questions you have with your health care provider. Document Released: 09/13/2012 Document Revised: 04/08/2016 Document Reviewed: 04/08/2016 Elsevier Interactive Patient Education  2019 [employer]    DASH Eating Plan DASH stands for "Dietary Approaches to Stop Hypertension." The DASH eating plan is a healthy eating plan that has been shown to reduce high blood pressure (hypertension). It may also reduce your risk for type 2 diabetes, heart disease, and stroke. The DASH eating plan may also help with weight loss. What are tips for following this plan?  General guidelines  Avoid eating more than 2,300 mg (milligrams) of salt (sodium) a day. If you have hypertension, you may need to reduce your sodium intake to 1,500 mg a day.  Limit alcohol intake to no more than 1 drink a day for nonpregnant women and 2 drinks a day for men. One drink equals 12 oz of beer, 5 oz of wine, or 1 oz of hard liquor.  Work with your health care provider to maintain a healthy body weight  or to lose weight. Ask what an ideal weight is for you.  Get at least 30 minutes of exercise that causes your heart to beat faster (aerobic exercise) most days of the week. Activities may include walking, swimming, or biking.  Work with your health care provider or diet and nutrition specialist (dietitian) to adjust your eating plan to your individual calorie needs. Reading food labels   Check food labels for the amount of sodium per serving. Choose foods with less than 5 percent of the Daily Value of sodium. Generally, foods with less than 300 mg of sodium per serving fit into this eating plan.  To find whole grains, look for the word "whole" as the first word in the ingredient list. Shopping  Buy products labeled as "low-sodium" or "no salt added."  Buy fresh foods. Avoid canned foods and premade or frozen meals. Cooking  Avoid adding salt when cooking. Use salt-free seasonings or herbs instead of table salt or sea salt. Check with your health care provider or pharmacist before  using salt substitutes.  Do not fry foods. Cook foods using healthy methods such as baking, boiling, grilling, and broiling instead.  Cook with heart-healthy oils, such as olive, canola, soybean, or sunflower oil. Meal planning  Eat a balanced diet that includes: ? 5 or more servings of fruits and vegetables each day. At each meal, try to fill half of your plate with fruits and vegetables. ? Up to 6-8 servings of whole grains each day. ? Less than 6 oz of lean meat, poultry, or fish each day. A 3-oz serving of meat is about the same size as a deck of cards. One egg equals 1 oz. ? 2 servings of low-fat dairy each day. ? A serving of nuts, seeds, or beans 5 times each week. ? Heart-healthy fats. Healthy fats called Omega-3 fatty acids are found in foods such as flaxseeds and coldwater fish, like sardines, salmon, and mackerel.  Limit how much you eat of the following: ? Canned or prepackaged foods. ? Food  that is high in trans fat, such as fried foods. ? Food that is high in saturated fat, such as fatty meat. ? Sweets, desserts, sugary drinks, and other foods with added sugar. ? Full-fat dairy products.  Do not salt foods before eating.  Try to eat at least 2 vegetarian meals each week.  Eat more home-cooked food and less restaurant, buffet, and fast food.  When eating at a restaurant, ask that your food be prepared with less salt or no salt, if possible. What foods are recommended? The items listed may not be a complete list. Talk with your dietitian about what dietary choices are best for you. Grains Whole-grain or whole-wheat bread. Whole-grain or whole-wheat pasta. Brown rice. Orpah Cobb. Bulgur. Whole-grain and low-sodium cereals. Pita bread. Low-fat, low-sodium crackers. Whole-wheat flour tortillas. Vegetables Fresh or frozen vegetables (raw, steamed, roasted, or grilled). Low-sodium or reduced-sodium tomato and vegetable juice. Low-sodium or reduced-sodium tomato sauce and tomato paste. Low-sodium or reduced-sodium canned vegetables. Fruits All fresh, dried, or frozen fruit. Canned fruit in natural juice (without added sugar). Meat and other protein foods Skinless chicken or [place]. Ground chicken or [place]. Pork with fat trimmed off. Fish and seafood. Egg whites. Dried beans, peas, or lentils. Unsalted nuts, nut butters, and seeds. Unsalted canned beans. Lean cuts of beef with fat trimmed off. Low-sodium, lean deli meat. Dairy Low-fat (1%) or fat-free (skim) milk. Fat-free, low-fat, or reduced-fat cheeses. Nonfat, low-sodium ricotta or cottage cheese. Low-fat or nonfat yogurt. Low-fat, low-sodium cheese. Fats and oils Soft margarine without trans fats. Vegetable oil. Low-fat, reduced-fat, or light mayonnaise and salad dressings (reduced-sodium). Canola, safflower, olive, soybean, and sunflower oils. Avocado. Seasoning and other foods Herbs. Spices. Seasoning mixes without salt.  Unsalted popcorn and pretzels. Fat-free sweets. What foods are not recommended? The items listed may not be a complete list. Talk with your dietitian about what dietary choices are best for you. Grains Baked goods made with fat, such as croissants, muffins, or some breads. Dry pasta or rice meal packs. Vegetables Creamed or fried vegetables. Vegetables in a cheese sauce. Regular canned vegetables (not low-sodium or reduced-sodium). Regular canned tomato sauce and paste (not low-sodium or reduced-sodium). Regular tomato and vegetable juice (not low-sodium or reduced-sodium). Rosita Fire. Olives. Fruits Canned fruit in a light or heavy syrup. Fried fruit. Fruit in cream or butter sauce. Meat and other protein foods Fatty cuts of meat. Ribs. Fried meat. Tomasa Blase. Sausage. Bologna and other processed lunch meats. Salami. Fatback. Hotdogs. Bratwurst. Salted nuts and  seeds. Canned beans with added salt. Canned or smoked fish. Whole eggs or egg yolks. Chicken or [place] with skin. Dairy Whole or 2% milk, cream, and half-and-half. Whole or full-fat cream cheese. Whole-fat or sweetened yogurt. Full-fat cheese. Nondairy creamers. Whipped toppings. Processed cheese and cheese spreads. Fats and oils Butter. Stick margarine. Lard. Shortening. Ghee. Bacon fat. Tropical oils, such as coconut, palm kernel, or palm oil. Seasoning and other foods Salted popcorn and pretzels. Onion salt, garlic salt, seasoned salt, table salt, and sea salt. Worcestershire sauce. Tartar sauce. Barbecue sauce. Teriyaki sauce. Soy sauce, including reduced-sodium. Steak sauce. Canned and packaged gravies. Fish sauce. Oyster sauce. Cocktail sauce. Horseradish that you find on the shelf. Ketchup. Mustard. Meat flavorings and tenderizers. Bouillon cubes. Hot sauce and Tabasco sauce. Premade or packaged marinades. Premade or packaged taco seasonings. Relishes. Regular salad dressings. Where to find more information:  National Heart, Lung, and Blood  Institute: PopSteam.is  American Heart Association: www.heart.org Summary  The DASH eating plan is a healthy eating plan that has been shown to reduce high blood pressure (hypertension). It may also reduce your risk for type 2 diabetes, heart disease, and stroke.  With the DASH eating plan, you should limit salt (sodium) intake to 2,300 mg a day. If you have hypertension, you may need to reduce your sodium intake to 1,500 mg a day.  When on the DASH eating plan, aim to eat more fresh fruits and vegetables, whole grains, lean proteins, low-fat dairy, and heart-healthy fats.  Work with your health care provider or diet and nutrition specialist (dietitian) to adjust your eating plan to your individual calorie needs. This information is not intended to replace advice given to you by your health care provider. Make sure you discuss any questions you have with your health care provider. Document Released: 05/08/2011 Document Revised: 05/12/2016 Document Reviewed: 05/12/2016 Elsevier Interactive Patient Education  2019 [employer].

## 2018-08-02 NOTE — Progress Notes (Signed)
[facility]   New Patient--Hospital Follow Up--Establish Care  Subjective:  Patient ID: Tanya Hanson, female    DOB: December 25, 1995  Age: 23 y.o. MRN: [id number]  CC:  Chief Complaint  Patient presents with  . Establish Care  . Migraine  . Menstrual Problem   HPI Tanya Hanson is a 23 year old female who presents for Hospital Follow Up and to Establish Care today.   Past Medical History:  Diagnosis Date  . Generalized anxiety disorder   . Hirsutism   . Irregular menstrual cycle      Current Status: [name] is here today to Establish Care. She states that she is doing well with no complaints.  She is currently in college for administrative medical assistance. She state she has not had a PCP since high school. She has irregular periods for a few years. She describes her periods as 'very crampy' and they usually last 4 days, and have normal flow. Her most recent periods are: 03/2018, 05/02/2018, 06/11/2018, and no other period since then. She does reports Hirsutisum. She c/o frequent headaches. She does not have an aura with headaches. She takes Acetaminophen and Motrin for relief. She has mild anxiety. She has had a recent friend to pass away and has family issues. She denies suicidal ideations, homicidal ideations, or auditory hallucinations. She does not exercise regularly.   She denies fevers, chills, fatigue, recent infections, weight loss, and night sweats. She has not had any headaches, visual changes, dizziness, and falls. No chest pain, heart palpitations, cough and shortness of breath reported. No reports of GI problems such as nausea, vomiting, diarrhea, and constipation. She has no reports of blood in stools, dysuria and hematuria. She denies pain today.  No past surgical history on file.  No family history on file.  Social History   Socioeconomic History  . Marital status: Single    Spouse name: Not on file  .  Number of children: Not on file  . Years of education: Not on file  . Highest education level: Not on file  Occupational History  . Not on file  Social Needs  . Financial resource strain: Not on file  . Food insecurity:    Worry: Not on file    Inability: Not on file  . Transportation needs:    Medical: Not on file    Non-medical: Not on file  Tobacco Use  . Smoking status: Never Smoker  . Smokeless tobacco: Never Used  Substance and Sexual Activity  . Alcohol use: No  . Drug use: No  . Sexual activity: Not on file  Lifestyle  . Physical activity:    Days per week: Not on file    Minutes per session: Not on file  . Stress: Not on file  Relationships  . Social connections:    Talks on phone: Not on file    Gets together: Not on file    Attends religious service: Not on file    Active member of club or organization: Not on file    Attends meetings of clubs or organizations: Not on file    Relationship status: Not on file  . Intimate partner violence:    Fear of current or ex partner: Not on file    Emotionally abused: Not on file    Physically abused: Not on file    Forced sexual activity: Not on file  Other Topics Concern  . Not on file  Social History Narrative  . Not on file    Outpatient Medications Prior to Visit  Medication Sig Dispense Refill  . amoxicillin-clavulanate (AUGMENTIN) 875-125 MG tablet Take 1 tablet by mouth every 12 (twelve) hours. Fill on 3/17 if symptoms not improving. (Patient not taking: Reported on 11/03/2017) 14 tablet 0  . benzonatate (TESSALON) 100 MG capsule Take 1 capsule (100 mg total) by mouth every 8 (eight) hours. (Patient not taking: Reported on 11/03/2017) 21 capsule 0  . cetirizine-pseudoephedrine (ZYRTEC-D) 5-120 MG tablet Take 1 tablet by mouth daily. (Patient not taking: Reported on 11/03/2017) 15 tablet 0  . fluticasone (FLONASE) 50 MCG/ACT nasal spray Place 2 sprays into both nostrils daily. 1 g 0  . HYDROcodone-acetaminophen  (NORCO) 5-325 MG tablet Take 1 tablet by mouth every 6 (six) hours as needed. (Patient not taking: Reported on 05/28/2017) 15 tablet 0  . ibuprofen (ADVIL,MOTRIN) 200 MG tablet Take 200 mg by mouth every 6 (six) hours as needed for headache.    . ibuprofen (ADVIL,MOTRIN) 800 MG tablet Take 1 tablet (800 mg total) by mouth every 8 (eight) hours as needed. (Patient not taking: Reported on 11/03/2017) 21 tablet 0  . ipratropium (ATROVENT) 0.06 % nasal spray Place 2 sprays into both nostrils 4 (four) times daily. (Patient not taking: Reported on 11/03/2017) 15 mL 0  . ondansetron (ZOFRAN) 4 MG tablet Take 1 tablet (4 mg total) by mouth every 6 (six) hours. (Patient not taking: Reported on 05/28/2017) 12 tablet 0   No facility-administered medications prior to visit.     No Known Allergies  ROS Review of Systems  Constitutional: Negative.   HENT: Negative.   Eyes: Negative.   Respiratory: Negative.   Cardiovascular: Negative.   Gastrointestinal: Positive for abdominal distention (obese).  Endocrine: Negative.   Genitourinary: Negative.   Musculoskeletal: Negative.   Skin: Negative.   Allergic/Immunologic: Negative.   Neurological: Negative.   Hematological: Negative.   Psychiatric/Behavioral: Negative.    Objective:   Physical Exam  Constitutional: She is oriented to person, place, and time. She appears well-developed and well-nourished.  HENT:  Head: Normocephalic and atraumatic.  Eyes: Conjunctivae are normal.  Neck: Normal range of motion. Neck supple.  Cardiovascular: Normal rate, regular rhythm, normal heart sounds and intact distal pulses.  Pulmonary/Chest: Effort normal and breath sounds normal.  Abdominal: Soft. Bowel sounds are normal.  Musculoskeletal: Normal range of motion.  Neurological: She is alert and oriented to person, place, and time. She has normal reflexes.  Skin: Skin is warm and dry.  Psychiatric: She has a normal mood and affect. Her behavior is normal.  Judgment and thought content normal.  Nursing note and vitals reviewed.   BP 140/90 (BP Location: Left Arm, Patient Position: Sitting, Cuff Size: Large)   Pulse 92   Temp 98 F (36.7 C) (Oral)   Ht 5' 4.5" (1.638 m)   Wt 260 lb (117.9 kg)   LMP 06/11/2018   SpO2 100%   BMI 43.94 kg/m  Wt Readings from Last 3 Encounters:  08/02/18 260 lb (117.9 kg)  11/02/17 200 lb (90.7 kg)  05/28/17 190 lb (86.2 kg)    Health Maintenance Due  Topic Date Due  . HIV Screening  05/21/2012  . PAP-Cervical Cytology Screening  05/21/2018  . PAP SMEAR-Modifier  05/21/2018    There are no preventive care reminders to display for this patient.  No results found for: TSH Lab Results  Component Value Date   WBC 8.9 05/28/2017   HGB  14.3 05/28/2017   HCT 42.9 05/28/2017   MCV 94.3 05/28/2017   PLT 231 05/28/2017   Lab Results  Component Value Date   NA 138 05/28/2017   K 3.8 05/28/2017   CO2 25 05/28/2017   GLUCOSE 72 05/28/2017   BUN 7 05/28/2017   CREATININE 0.97 05/28/2017   BILITOT 0.5 05/28/2017   ALKPHOS 79 05/28/2017   AST 18 05/28/2017   ALT 14 05/28/2017   PROT 6.7 05/28/2017   ALBUMIN 4.0 05/28/2017   CALCIUM 9.2 05/28/2017   ANIONGAP 7 05/28/2017   No results found for: CHOL No results found for: HDL No results found for: LDLCALC No results found for: TRIG No results found for: [facility] Lab Results  Component Value Date   HGBA1C 5.0 08/02/2018   Assessment & Plan:  Body mass index is 43.94 kg/m. 1. Hospital discharge follow-up  2. Encounter to establish care  3. PCOS (polycystic ovarian syndrome) - CBC with Differential - Comprehensive metabolic panel - TSH - Testosterone - Prolactin - Lipid Panel - FSH/LH  4. Irregular periods/menstrual cycles We will evaluate her for PCOS today. Monitor.   5. Hirsutism  6. Generalized anxiety disorder  7. Obese  8. Screening for diabetes mellitus Hgb A1c is normal at 5.0 today.  - POCT glycosylated hemoglobin  (Hb A1C) - POCT urinalysis dipstick  9. Need for immunization against influenza - Flu Vaccine QUAD 36+ mos IM  10. Need for Tdap vaccination - Tdap vaccine greater than or equal to 7yo IM  11. Healthcare maintenance - CBC with Differential - Comprehensive metabolic panel - TSH - Testosterone - Prolactin - Lipid Panel - FSH/LH  12. Abnormal urinalysis Results are pending.  - Urine Culture  13. Follow up She will follow up in 1 month. We will discuss possible Counseling Services at next office.   No orders of the defined types were placed in this encounter.   Orders Placed This Encounter  Procedures  . Urine Culture  . Flu Vaccine QUAD 36+ mos IM  . Tdap vaccine greater than or equal to 7yo IM  . CBC with Differential  . Comprehensive metabolic panel  . TSH  . Testosterone  . Prolactin  . Lipid Panel  . FSH/LH  . POCT glycosylated hemoglobin (Hb A1C)  . POCT urinalysis dipstick   Referral Orders  No referral(s) requested today    Raliegh Ip,  MSN, FNP-C [facility] [address] [place], [place] [address] [phone]   Problem List Items Addressed This Visit    None    Visit Diagnoses    Hospital discharge follow-up    -  Primary   Encounter to establish care       PCOS (polycystic ovarian syndrome)       Relevant Orders   CBC with Differential   Comprehensive metabolic panel   TSH   Testosterone   Prolactin   Lipid Panel   FSH/LH   Irregular periods/menstrual cycles       Hirsutism       Generalized anxiety disorder       Class 3 severe obesity due to excess calories without serious comorbidity with body mass index (BMI) of 40.0 to 44.9 in adult [facility])       Screening for diabetes mellitus       Relevant Orders   POCT glycosylated hemoglobin (Hb A1C) (Completed)   POCT urinalysis dipstick (Completed)   Need for immunization against influenza  Relevant Orders   Flu Vaccine QUAD 36+ mos IM  (Completed)   Need for Tdap vaccination       Relevant Orders   Tdap vaccine greater than or equal to 7yo IM (Completed)   Healthcare maintenance       Relevant Orders   CBC with Differential   Comprehensive metabolic panel   TSH   Testosterone   Prolactin   Lipid Panel   FSH/LH   Abnormal urinalysis       Relevant Orders   Urine Culture   Follow up          No orders of the defined types were placed in this encounter.   Follow-up: Return in about 1 month (around 09/02/2018).    Kallie Locks, FNP

## 2018-08-03 LAB — LIPID PANEL
Chol/HDL Ratio: 5 ratio — ABNORMAL HIGH (ref 0.0–4.4)
Cholesterol, Total: 186 mg/dL (ref 100–199)
HDL: 37 mg/dL — ABNORMAL LOW (ref >39)
LDL Calculated: 125 mg/dL — ABNORMAL HIGH (ref 0–99)
Triglycerides: 121 mg/dL (ref 0–149)
VLDL Cholesterol Cal: 24 mg/dL (ref 5–40)

## 2018-08-03 LAB — TESTOSTERONE: Testosterone: 114 ng/dL — ABNORMAL HIGH (ref 8–48)

## 2018-08-03 LAB — COMPREHENSIVE METABOLIC PANEL
ALT: 21 IU/L (ref 0–32)
AST: 18 IU/L (ref 0–40)
Albumin/Globulin Ratio: 1.6 (ref 1.2–2.2)
Albumin: 4.4 g/dL (ref 3.9–5.0)
Alkaline Phosphatase: 94 IU/L (ref 39–117)
BUN/Creatinine Ratio: 7 — ABNORMAL LOW (ref 9–23)
BUN: 8 mg/dL (ref 6–20)
Bilirubin Total: 0.9 mg/dL (ref 0.0–1.2)
CO2: 23 mmol/L (ref 20–29)
Calcium: 9.7 mg/dL (ref 8.7–10.2)
Chloride: 103 mmol/L (ref 96–106)
Creatinine, Ser: 1.1 mg/dL — ABNORMAL HIGH (ref 0.57–1.00)
GFR calc Af Amer: 83 mL/min/{1.73_m2} (ref >59)
GFR calc non Af Amer: 72 mL/min/{1.73_m2} (ref >59)
Globulin, Total: 2.7 g/dL (ref 1.5–4.5)
Glucose: 88 mg/dL (ref 65–99)
Potassium: 3.9 mmol/L (ref 3.5–5.2)
Sodium: 139 mmol/L (ref 134–144)
Total Protein: 7.1 g/dL (ref 6.0–8.5)

## 2018-08-03 LAB — CBC WITH DIFFERENTIAL/PLATELET
Basophils Absolute: 0 10*3/uL (ref 0.0–0.2)
Basos: 0 %
EOS (ABSOLUTE): 0 10*3/uL (ref 0.0–0.4)
Eos: 0 %
Hematocrit: 40 % (ref 34.0–46.6)
Hemoglobin: 13.4 g/dL (ref 11.1–15.9)
Immature Grans (Abs): 0 10*3/uL (ref 0.0–0.1)
Immature Granulocytes: 0 %
Lymphocytes Absolute: 1.8 10*3/uL (ref 0.7–3.1)
Lymphs: 17 %
MCH: 30.9 pg (ref 26.6–33.0)
MCHC: 33.5 g/dL (ref 31.5–35.7)
MCV: 92 fL (ref 79–97)
Monocytes Absolute: 0.9 10*3/uL (ref 0.1–0.9)
Monocytes: 8 %
Neutrophils Absolute: 8 10*3/uL — ABNORMAL HIGH (ref 1.4–7.0)
Neutrophils: 75 %
Platelets: 228 10*3/uL (ref 150–450)
RBC: 4.34 x10E6/uL (ref 3.77–5.28)
RDW: 12.3 % (ref 11.7–15.4)
WBC: 10.8 10*3/uL (ref 3.4–10.8)

## 2018-08-03 LAB — PROLACTIN: Prolactin: 20.7 ng/mL (ref 4.8–23.3)

## 2018-08-03 LAB — FSH/LH
FSH: 5.3 m[IU]/mL
LH: 14.3 m[IU]/mL

## 2018-08-03 LAB — TSH: TSH: 3.37 u[IU]/mL (ref 0.450–4.500)

## 2018-08-04 LAB — URINE CULTURE

## 2018-09-06 ENCOUNTER — Other Ambulatory Visit: Payer: Self-pay

## 2018-09-06 ENCOUNTER — Ambulatory Visit: Payer: Federal, State, Local not specified - PPO | Admitting: Family Medicine

## 2018-09-06 ENCOUNTER — Telehealth: Payer: Self-pay | Admitting: Family Medicine

## 2018-09-06 NOTE — Telephone Encounter (Signed)
Multiple attempts to contact patient today for Telephone Virtual Visit. Message left on patient's voicemail to return call.

## 2019-03-22 ENCOUNTER — Ambulatory Visit (INDEPENDENT_AMBULATORY_CARE_PROVIDER_SITE_OTHER): Payer: Federal, State, Local not specified - PPO | Admitting: Family Medicine

## 2019-03-22 ENCOUNTER — Other Ambulatory Visit: Payer: Self-pay

## 2019-03-22 VITALS — BP 130/78 | HR 82 | Temp 98.8°F | Resp 18 | Ht 64.5 in | Wt 281.4 lb

## 2019-03-22 DIAGNOSIS — F411 Generalized anxiety disorder: Secondary | ICD-10-CM

## 2019-03-22 DIAGNOSIS — Z6841 Body Mass Index (BMI) 40.0 and over, adult: Secondary | ICD-10-CM

## 2019-03-22 DIAGNOSIS — E66813 Obesity, class 3: Secondary | ICD-10-CM

## 2019-03-22 DIAGNOSIS — R8290 Unspecified abnormal findings in urine: Secondary | ICD-10-CM | POA: Diagnosis not present

## 2019-03-22 DIAGNOSIS — G44201 Tension-type headache, unspecified, intractable: Secondary | ICD-10-CM | POA: Diagnosis not present

## 2019-03-22 DIAGNOSIS — N926 Irregular menstruation, unspecified: Secondary | ICD-10-CM

## 2019-03-22 DIAGNOSIS — Z09 Encounter for follow-up examination after completed treatment for conditions other than malignant neoplasm: Secondary | ICD-10-CM

## 2019-03-22 DIAGNOSIS — Z23 Encounter for immunization: Secondary | ICD-10-CM

## 2019-03-22 LAB — POCT URINALYSIS DIPSTICK
Bilirubin, UA: NEGATIVE
Blood, UA: NEGATIVE
Glucose, UA: NEGATIVE
Ketones, UA: NEGATIVE
Nitrite, UA: NEGATIVE
Protein, UA: NEGATIVE
Spec Grav, UA: 1.01 (ref 1.010–1.025)
Urobilinogen, UA: 0.2 E.U./dL
pH, UA: 6 (ref 5.0–8.0)

## 2019-03-22 MED ORDER — NORETHIN ACE-ETH ESTRAD-FE 1-20 MG-MCG PO TABS: 1.0000 | ORAL_TABLET | Freq: Every day | ORAL | 11 refills | Status: DC

## 2019-03-22 NOTE — Progress Notes (Signed)
Patient Tanya Hanson    Established Patient Office Visit  Subjective:  Patient ID: Tanya Hanson, female    DOB: Mar 07, 1996  Age: 23 y.o. MRN: [id number]  CC:  Chief Complaint  Patient presents with  . Follow-up    HPI Tanya Hanson is a 23 year old female who presents for Follow Up today.   Past Medical History:  Diagnosis Date  . Generalized anxiety disorder   . Hirsutism   . Irregular menstrual cycle    Current Status: Since her last office visit, she is doing well with no complaints. She continues to have irregular menstrual cycles. She was previously on birth control pills as a teenage and states that periods remained unstable. She denies fevers, chills, fatigue, recent infections, weight loss, and night sweats. She has not had any headaches, visual changes, dizziness, and falls. No chest pain, heart palpitations, cough and shortness of breath reported. No reports of GI problems such as nausea, vomiting, diarrhea, and constipation. She has no reports of blood in stools, dysuria and hematuria. Her anxiety is mild today. She denies suicidal ideations, homicidal ideations, or auditory hallucinations. She denies pain today.    No past surgical history on file.  No family history on file.  Social History   Socioeconomic History  . Marital status: Single    Spouse name: Not on file  . Number of children: Not on file  . Years of education: Not on file  . Highest education level: Not on file  Occupational History  . Not on file  Social Needs  . Financial resource strain: Not on file  . Food insecurity    Worry: Not on file    Inability: Not on file  . Transportation needs    Medical: Not on file    Non-medical: Not on file  Tobacco Use  . Smoking status: Never Smoker  . Smokeless tobacco: Never Used  Substance and Sexual Activity  . Alcohol use: No  . Drug use: No  . Sexual activity: Not on file  Lifestyle  .  Physical activity    Days per week: Not on file    Minutes per session: Not on file  . Stress: Not on file  Relationships  . Social Herbalist on phone: Not on file    Gets together: Not on file    Attends religious service: Not on file    Active member of club or organization: Not on file    Attends meetings of clubs or organizations: Not on file    Relationship status: Not on file  . Intimate partner violence    Fear of current or ex partner: Not on file    Emotionally abused: Not on file    Physically abused: Not on file    Forced sexual activity: Not on file  Other Topics Concern  . Not on file  Social History Narrative  . Not on file    No outpatient medications prior to visit.   No facility-administered medications prior to visit.     No Known Allergies  ROS Review of Systems  Constitutional: Negative.   HENT: Negative.   Eyes: Negative.   Respiratory: Negative.   Cardiovascular: Negative.   Gastrointestinal: Positive for abdominal distention.  Endocrine: Negative.   Genitourinary: Positive for menstrual problem (irregular periods).  Musculoskeletal: Negative.   Skin: Negative.   Allergic/Immunologic: Negative.   Neurological: Negative.   Hematological: Negative.   Psychiatric/Behavioral:  Negative.       Objective:    Physical Exam  Constitutional: She is oriented to person, place, and time. She appears well-developed and well-nourished.  HENT:  Head: Normocephalic and atraumatic.  Eyes: Conjunctivae are normal.  Neck: Normal range of motion. Neck supple.  Cardiovascular: Normal rate, regular rhythm, normal heart sounds and intact distal pulses.  Pulmonary/Chest: Effort normal and breath sounds normal.  Abdominal: Soft. Bowel sounds are normal. She exhibits distension (obese).  Musculoskeletal: Normal range of motion.  Neurological: She is alert and oriented to person, place, and time. She has normal reflexes.  Skin: Skin is warm and dry.   Psychiatric: She has a normal mood and affect. Her behavior is normal. Judgment and thought content normal.  Nursing note and vitals reviewed.   BP 130/78 (BP Location: Right Arm, Patient Position: Sitting, Cuff Size: Large)   Pulse 82   Temp 98.8 F (37.1 C) (Oral)   Resp 18   Ht 5' 4.5" (1.638 m)   Wt 281 lb 6.4 oz (127.6 kg)   SpO2 100%   BMI 47.56 kg/m  Wt Readings from Last 3 Encounters:  03/22/19 281 lb 6.4 oz (127.6 kg)  08/02/18 260 lb (117.9 kg)  11/02/17 200 lb (90.7 kg)     Health Maintenance Due  Topic Date Due  . HIV Screening  05/21/2012  . PAP-Cervical Cytology Screening  05/21/2018  . PAP SMEAR-Modifier  05/21/2018    There are no preventive [facility]on reminders to display for this patient.  Lab Results  Component Value Date   TSH 3.370 08/02/2018   Lab Results  Component Value Date   WBC 10.8 08/02/2018   HGB 13.4 08/02/2018   HCT 40.0 08/02/2018   MCV 92 08/02/2018   PLT 228 08/02/2018   Lab Results  Component Value Date   NA 139 08/02/2018   K 3.9 08/02/2018   CO2 23 08/02/2018   GLUCOSE 88 08/02/2018   BUN 8 08/02/2018   CREATININE 1.10 (H) 08/02/2018   BILITOT 0.9 08/02/2018   ALKPHOS 94 08/02/2018   AST 18 08/02/2018   ALT 21 08/02/2018   PROT 7.1 08/02/2018   ALBUMIN 4.4 08/02/2018   CALCIUM 9.7 08/02/2018   ANIONGAP 7 05/28/2017   Lab Results  Component Value Date   CHOL 186 08/02/2018   Lab Results  Component Value Date   HDL 37 (L) 08/02/2018   Lab Results  Component Value Date   LDLCALC 125 (H) 08/02/2018   Lab Results  Component Value Date   TRIG 121 08/02/2018   Lab Results  Component Value Date   CHOLHDL 5.0 (H) 08/02/2018   Lab Results  Component Value Date   HGBA1C 5.0 08/02/2018   Assessment & Plan:   1. Irregular periods/menstrual cycles We will initiate birth control pills today.  - norethindrone-ethinyl estradiol (LOESTRIN FE) 1-20 MG-MCG tablet; Take 1 tablet by mouth daily.  Dispense: 1 Package;  Refill: 11  2. Intractable tension-type headache, unspecified chronicity pattern She will increase her fluid intake and take Acetaminophen as needed. She will report to office if needed.   3. Generalized anxiety disorder  4. Class 3 severe obesity due to excess calories without serious comorbidity with body mass index (BMI) of 45.0 to 49.9 in adult [facility](HCC) Body mass index is 47.56 kg/m.  Goal BMI  is <30. Encouraged efforts to reduce weight include engaging in physical activity as tolerated with goal of 150 minutes per week. Improve dietary choices and eat a meal  regimen consistent with a Mediterranean or DASH diet. Reduce simple carbohydrates. Do not skip meals and eat healthy snacks throughout the day to avoid over-eating at dinner. Set a goal weight loss that is achievable for you. We will assess option for weight loss. Monitor.   5. Abnormal urinalysis - POCT urinalysis dipstick  6. Flu vaccine need - Flu Vaccine QUAD 36+ mos IM  7. Follow up She will follow up in 8 months.   Meds ordered this encounter  Medications  . norethindrone-ethinyl estradiol (LOESTRIN FE) 1-20 MG-MCG tablet    Sig: Take 1 tablet by mouth daily.    Dispense:  1 Package    Refill:  11   Orders Placed This Encounter  Procedures  . Flu Vaccine QUAD 36+ mos IM  . POCT urinalysis dipstick    Referral Orders  No referral(s) requested today    Raliegh Ip,  MSN, FNP-BC [place] [facility] [address] [place], [place] [address] [phone]- fax  Problem List Items Addressed This Visit      Other   Generalized anxiety disorder   Irregular periods/menstrual cycles - Primary   Relevant Medications   norethindrone-ethinyl estradiol (LOESTRIN FE) 1-20 MG-MCG tablet    Other Visit Diagnoses    Intractable tension-type headache, unspecified chronicity pattern       Class 3 severe obesity due to excess calories without  serious comorbidity with body mass index (BMI) of 45.0 to 49.9 in adult (HCC)       Abnormal urinalysis       Relevant Orders   POCT urinalysis dipstick (Completed)   Flu vaccine need       Relevant Orders   Flu Vaccine QUAD 36+ mos IM (Completed)   Follow up          Meds ordered this encounter  Medications  . norethindrone-ethinyl estradiol (LOESTRIN FE) 1-20 MG-MCG tablet    Sig: Take 1 tablet by mouth daily.    Dispense:  1 Package    Refill:  11    Follow-up: No follow-ups on file.    Kallie Locks, FNP

## 2019-03-23 DIAGNOSIS — G44201 Tension-type headache, unspecified, intractable: Secondary | ICD-10-CM | POA: Insufficient documentation

## 2019-04-25 ENCOUNTER — Other Ambulatory Visit: Payer: Self-pay

## 2019-04-25 DIAGNOSIS — Z20822 Contact with and (suspected) exposure to covid-19: Secondary | ICD-10-CM

## 2019-04-27 ENCOUNTER — Ambulatory Visit: Payer: Self-pay

## 2019-04-27 LAB — NOVEL CORONAVIRUS, NAA: SARS-CoV-2, NAA: DETECTED — AB

## 2019-04-27 NOTE — Telephone Encounter (Signed)
Provided Covid-19 lab results to patient.  Patient voiced understanding.  Provided care advise.  Patient states that she has no taste nor smell reassured Pt.  That taste and smell will return.

## 2019-11-09 ENCOUNTER — Encounter: Payer: Self-pay | Admitting: Family Medicine

## 2019-11-09 ENCOUNTER — Other Ambulatory Visit: Payer: Self-pay

## 2019-11-09 ENCOUNTER — Ambulatory Visit (INDEPENDENT_AMBULATORY_CARE_PROVIDER_SITE_OTHER): Payer: Federal, State, Local not specified - PPO | Admitting: Family Medicine

## 2019-11-09 VITALS — BP 126/70 | HR 73 | Temp 97.4°F | Ht 64.5 in | Wt 278.0 lb

## 2019-11-09 DIAGNOSIS — E282 Polycystic ovarian syndrome: Secondary | ICD-10-CM

## 2019-11-09 DIAGNOSIS — N926 Irregular menstruation, unspecified: Secondary | ICD-10-CM | POA: Diagnosis not present

## 2019-11-09 DIAGNOSIS — R82998 Other abnormal findings in urine: Secondary | ICD-10-CM | POA: Diagnosis not present

## 2019-11-09 DIAGNOSIS — Z0000 Encounter for general adult medical examination without abnormal findings: Secondary | ICD-10-CM

## 2019-11-09 DIAGNOSIS — Z3200 Encounter for pregnancy test, result unknown: Secondary | ICD-10-CM

## 2019-11-09 DIAGNOSIS — E66813 Obesity, class 3: Secondary | ICD-10-CM

## 2019-11-09 DIAGNOSIS — Z8616 Personal history of COVID-19: Secondary | ICD-10-CM

## 2019-11-09 DIAGNOSIS — L680 Hirsutism: Secondary | ICD-10-CM | POA: Diagnosis not present

## 2019-11-09 DIAGNOSIS — R0602 Shortness of breath: Secondary | ICD-10-CM

## 2019-11-09 DIAGNOSIS — Z6841 Body Mass Index (BMI) 40.0 and over, adult: Secondary | ICD-10-CM

## 2019-11-09 DIAGNOSIS — Z09 Encounter for follow-up examination after completed treatment for conditions other than malignant neoplasm: Secondary | ICD-10-CM

## 2019-11-09 LAB — POCT URINALYSIS DIPSTICK
Bilirubin, UA: NEGATIVE
Blood, UA: NEGATIVE
Glucose, UA: NEGATIVE
Ketones, UA: NEGATIVE
Protein, UA: NEGATIVE
Spec Grav, UA: 1.025 (ref 1.010–1.025)
Urobilinogen, UA: 0.2 E.U./dL
pH, UA: 7 (ref 5.0–8.0)

## 2019-11-09 LAB — POCT URINE PREGNANCY: Preg Test, Ur: NEGATIVE

## 2019-11-09 NOTE — Progress Notes (Signed)
[facility]  Annual Physical   Subjective:  Patient ID: Tanya Hanson, female    DOB: 11/30/1995  Age: 24 y.o. MRN: [id number]  CC:  Chief Complaint  Patient presents with  . Annual Exam    Needs physical for job; birth control did not work to regulate cycles  . TB Screening    HPI [name] is a 25 year old female who presents for Annual Physical and Follow Up today.   Past Medical History:  Diagnosis Date  . Generalized anxiety disorder   . Hirsutism   . Irregular menstrual cycle     No past surgical history on file.  Current Status: Since her last office visit, she is doing well with no complaints. She has been evaluated for PCOS, which she has not followed up with Gynecology. She states that she continues to have irregular menstrual periods despite taking oral birth control daily. She denies fevers, chills, fatigue, recent infections, weight loss, and night sweats. She has not had any headaches, visual changes, dizziness, and falls. No chest pain, heart palpitations, cough and shortness of breath reported. Denies GI problems such as nausea, vomiting, diarrhea, and constipation. She has no reports of blood in stools, dysuria and hematuria. No depression or anxiety, and denies suicidal ideations, homicidal ideations, or auditory hallucinations. She is taking all medications as prescribed. She denies pain today.   No family history on file.  Social History   Socioeconomic History  . Marital status: Single    Spouse name: Not on file  . Number of children: Not on file  . Years of education: Not on file  . Highest education level: Not on file  Occupational History  . Not on file  Tobacco Use  . Smoking status: Never Smoker  . Smokeless tobacco: Never Used  Substance and Sexual Activity  . Alcohol use: No  . Drug use: No  . Sexual activity: Not on file  Other Topics Concern  . Not on file  Social History  Narrative  . Not on file   Social Determinants of Health   Financial Resource Strain:   . Difficulty of Paying Living Expenses:   Food Insecurity:   . Worried About Programme researcher, broadcasting/film/video in the Last Year:   . Barista in the Last Year:   Transportation Needs:   . Freight forwarder (Medical):   Marland Kitchen Lack of Transportation (Non-Medical):   Physical Activity:   . Days of Exercise per Week:   . Minutes of Exercise per Session:   Stress:   . Feeling of Stress :   Social Connections:   . Frequency of Communication with Friends and Family:   . Frequency of Social Gatherings with Friends and Family:   . Attends Religious Services:   . Active Member of Clubs or Organizations:   . Attends Banker Meetings:   Marland Kitchen Marital Status:   Intimate Partner Violence:   . Fear of Current or Ex-Partner:   . Emotionally Abused:   Marland Kitchen Physically Abused:   . Sexually Abused:     Outpatient Medications Prior to Visit  Medication Sig Dispense Refill  . norethindrone-ethinyl estradiol (LOESTRIN FE) 1-20 MG-MCG tablet Take 1 tablet by mouth daily. 1 Package 11   No facility-administered medications prior to visit.    No Known Allergies  ROS Review of Systems  Constitutional: Negative.   HENT: Negative.   Eyes: Negative.   Respiratory:  Negative.   Cardiovascular: Negative.   Gastrointestinal: Positive for abdominal distention (obese).  Endocrine: Negative.   Genitourinary: Negative.   Musculoskeletal: Negative.   Skin: Negative.   Allergic/Immunologic: Negative.   Neurological: Negative.   Hematological: Negative.   Psychiatric/Behavioral: Negative.       Objective:    Physical Exam Vitals and nursing note reviewed.  HENT:     Head: Normocephalic.     Nose: Nose normal.     Mouth/Throat:     Mouth: Mucous membranes are moist.     Pharynx: Oropharynx is clear.  Cardiovascular:     Rate and Rhythm: Normal rate and regular rhythm.     Pulses: Normal pulses.      Heart sounds: Normal heart sounds.  Pulmonary:     Effort: Pulmonary effort is normal.     Breath sounds: Normal breath sounds.  Abdominal:     General: There is distension.  Musculoskeletal:        General: Normal range of motion.     Cervical back: Normal range of motion.  Skin:    General: Skin is warm and dry.  Neurological:     General: No focal deficit present.     Mental Status: She is alert.  Psychiatric:        Mood and Affect: Mood normal.        Behavior: Behavior normal.        Thought Content: Thought content normal.        Judgment: Judgment normal.     BP 126/70 (BP Location: Right Arm, Patient Position: Sitting, Cuff Size: Large)   Pulse 73   Temp (!) 97.4 F (36.3 C)   Ht 5' 4.5" (1.638 m)   Wt 278 lb 0.4 oz (126.1 kg)   SpO2 100%   BMI 46.99 kg/m  Wt Readings from Last 3 Encounters:  11/09/19 278 lb 0.4 oz (126.1 kg)  03/22/19 281 lb 6.4 oz (127.6 kg)  08/02/18 260 lb (117.9 kg)     Health Maintenance Due  Topic Date Due  . Hepatitis C Screening  Never done  . COVID-19 Vaccine (1) Never done  . HIV Screening  Never done  . PAP-Cervical Cytology Screening  Never done  . PAP SMEAR-Modifier  Never done    There are no preventive care reminders to display for this patient.  Lab Results  Component Value Date   TSH 3.370 08/02/2018   Lab Results  Component Value Date   WBC 10.8 08/02/2018   HGB 13.4 08/02/2018   HCT 40.0 08/02/2018   MCV 92 08/02/2018   PLT 228 08/02/2018   Lab Results  Component Value Date   NA 139 08/02/2018   K 3.9 08/02/2018   CO2 23 08/02/2018   GLUCOSE 88 08/02/2018   BUN 8 08/02/2018   CREATININE 1.10 (H) 08/02/2018   BILITOT 0.9 08/02/2018   ALKPHOS 94 08/02/2018   AST 18 08/02/2018   ALT 21 08/02/2018   PROT 7.1 08/02/2018   ALBUMIN 4.4 08/02/2018   CALCIUM 9.7 08/02/2018   ANIONGAP 7 05/28/2017   Lab Results  Component Value Date   CHOL 186 08/02/2018   Lab Results  Component Value Date   HDL 37  (L) 08/02/2018   Lab Results  Component Value Date   LDLCALC 125 (H) 08/02/2018   Lab Results  Component Value Date   TRIG 121 08/02/2018   Lab Results  Component Value Date   CHOLHDL 5.0 (H) 08/02/2018   Lab  Results  Component Value Date   HGBA1C 5.0 08/02/2018      Assessment & Plan:   1. Annual physical   2. PCOS (polycystic ovarian syndrome) - CBC with Differential; Future - Comprehensive metabolic panel; Future - FSH/LH; Future - Lipid Panel; Future - Prolactin; Future - Testosterone; Future - TSH; Future - Ambulatory referral to Obstetrics / Gynecology  3. Hirsutism - CBC with Differential; Future - Comprehensive metabolic panel; Future - FSH/LH; Future - Lipid Panel; Future - Prolactin; Future - Testosterone; Future - TSH; Future - Ambulatory referral to Obstetrics / Gynecology  4. Irregular periods/menstrual cycles - Ambulatory referral to Obstetrics / Gynecology  5. Shortness of breath - DG Chest 2 View; Future  6. History of 2019 novel coronavirus disease (COVID-19) Reports shortness of breath since Coronavirus.  - DG Chest 2 View; Future  6. Urine white blood cells increased Results are pending.  - Urine Culture  7. Healthcare maintenance - Urinalysis Dipstick - CBC with Differential; Future - Comprehensive metabolic panel; Future - FSH/LH; Future - Lipid Panel; Future - Prolactin; Future - Testosterone; Future - TSH; Future  8. Possible pregnancy - POCT urine pregnancy  9. Class 3 severe obesity due to excess calories without serious comorbidity with body mass index (BMI) of 45.0 to 49.9 in adult [facility]) Body mass index is 46.99 kg/m. Goal BMI  is <30. Encouraged efforts to reduce weight include engaging in physical activity as tolerated with goal of 150 minutes per week. Improve dietary choices and eat a meal regimen consistent with a Mediterranean or DASH diet. Reduce simple carbohydrates. Do not skip meals and eat healthy snacks  throughout the day to avoid over-eating at dinner. Set a goal weight loss that is achievable for you  10. Follow up She will follow up in 6 months.   No orders of the defined types were placed in this encounter.   Orders Placed This Encounter  Procedures  . Urine Culture  . DG Chest 2 View  . CBC with Differential  . Comprehensive metabolic panel  . FSH/LH  . Lipid Panel  . Prolactin  . Testosterone  . TSH  . Ambulatory referral to Obstetrics / Gynecology  . Urinalysis Dipstick  . POCT urine pregnancy     Referral Orders     Ambulatory referral to Obstetrics / Gynecology   Kathe Becton,  MSN, FNP-BC [place] [address] [place], [place] [address] [phone]- fax   Problem List Items Addressed This Visit      Endocrine   PCOS (polycystic ovarian syndrome) - Primary   Relevant Orders   CBC with Differential   Comprehensive metabolic panel   FSH/LH   Lipid Panel   Prolactin   Testosterone   TSH   Ambulatory referral to Obstetrics / Gynecology     Musculoskeletal and Integument   Hirsutism   Relevant Orders   CBC with Differential   Comprehensive metabolic panel   FSH/LH   Lipid Panel   Prolactin   Testosterone   TSH   Ambulatory referral to Obstetrics / Gynecology     Other   Irregular periods/menstrual cycles   Relevant Orders   Ambulatory referral to Obstetrics / Gynecology    Other Visit Diagnoses    Annual physical exam       Shortness of breath       Relevant Orders   DG Chest 2 View   History of 2019 novel  coronavirus disease (COVID-19)       Relevant Orders   DG Chest 2 View   Urine white blood cells increased       Relevant Orders   Urine Culture   Healthcare maintenance       Relevant Orders   Urinalysis Dipstick (Completed)   CBC with Differential   Comprehensive metabolic panel   FSH/LH   Lipid Panel   Prolactin   Testosterone   TSH     Possible pregnancy       Relevant Orders   POCT urine pregnancy (Completed)   Class 3 severe obesity due to excess calories without serious comorbidity with body mass index (BMI) of 45.0 to 49.9 in adult [facility])       Follow up          No orders of the defined types were placed in this encounter.   Follow-up: Return in about 8 months (around 07/11/2020).    Kallie Locks, FNP

## 2019-11-11 ENCOUNTER — Other Ambulatory Visit: Payer: Self-pay | Admitting: Family Medicine

## 2019-11-11 DIAGNOSIS — N3000 Acute cystitis without hematuria: Secondary | ICD-10-CM

## 2019-11-11 LAB — URINE CULTURE

## 2019-11-11 MED ORDER — SULFAMETHOXAZOLE-TRIMETHOPRIM 800-160 MG PO TABS: 1.0000 | ORAL_TABLET | Freq: Two times a day (BID) | ORAL | 0 refills | Status: DC

## 2019-11-15 NOTE — Progress Notes (Signed)
Left voicemail for patient to call back. 

## 2019-11-17 NOTE — Progress Notes (Signed)
Patient notified of results, stated she has already taking medication that was prescribed.

## 2019-11-21 ENCOUNTER — Ambulatory Visit: Payer: Federal, State, Local not specified - PPO | Admitting: Family Medicine

## 2019-11-23 ENCOUNTER — Other Ambulatory Visit: Payer: Self-pay

## 2019-11-23 ENCOUNTER — Other Ambulatory Visit: Payer: Federal, State, Local not specified - PPO

## 2019-11-23 DIAGNOSIS — L680 Hirsutism: Secondary | ICD-10-CM | POA: Diagnosis not present

## 2019-11-23 DIAGNOSIS — Z0000 Encounter for general adult medical examination without abnormal findings: Secondary | ICD-10-CM | POA: Diagnosis not present

## 2019-11-23 DIAGNOSIS — E282 Polycystic ovarian syndrome: Secondary | ICD-10-CM

## 2019-11-23 DIAGNOSIS — Z13220 Encounter for screening for lipoid disorders: Secondary | ICD-10-CM | POA: Diagnosis not present

## 2019-11-24 LAB — COMPREHENSIVE METABOLIC PANEL
ALT: 15 IU/L (ref 0–32)
AST: 14 IU/L (ref 0–40)
Albumin/Globulin Ratio: 1.7 (ref 1.2–2.2)
Albumin: 4 g/dL (ref 3.9–5.0)
Alkaline Phosphatase: 84 IU/L (ref 48–121)
BUN/Creatinine Ratio: 14 (ref 9–23)
BUN: 13 mg/dL (ref 6–20)
Bilirubin Total: <0.2 mg/dL (ref 0.0–1.2)
CO2: 22 mmol/L (ref 20–29)
Calcium: 8.8 mg/dL (ref 8.7–10.2)
Chloride: 106 mmol/L (ref 96–106)
Creatinine, Ser: 0.95 mg/dL (ref 0.57–1.00)
GFR calc Af Amer: 98 mL/min/{1.73_m2} (ref >59)
GFR calc non Af Amer: 85 mL/min/{1.73_m2} (ref >59)
Globulin, Total: 2.3 g/dL (ref 1.5–4.5)
Glucose: 87 mg/dL (ref 65–99)
Potassium: 4.3 mmol/L (ref 3.5–5.2)
Sodium: 139 mmol/L (ref 134–144)
Total Protein: 6.3 g/dL (ref 6.0–8.5)

## 2019-11-24 LAB — PROLACTIN: Prolactin: 27 ng/mL — ABNORMAL HIGH (ref 4.8–23.3)

## 2019-11-24 LAB — CBC WITH DIFFERENTIAL/PLATELET
Basophils Absolute: 0 10*3/uL (ref 0.0–0.2)
Basos: 1 %
EOS (ABSOLUTE): 0.1 10*3/uL (ref 0.0–0.4)
Eos: 1 %
Hematocrit: 39.1 % (ref 34.0–46.6)
Hemoglobin: 13 g/dL (ref 11.1–15.9)
Immature Grans (Abs): 0 10*3/uL (ref 0.0–0.1)
Immature Granulocytes: 0 %
Lymphocytes Absolute: 2.2 10*3/uL (ref 0.7–3.1)
Lymphs: 31 %
MCH: 31.4 pg (ref 26.6–33.0)
MCHC: 33.2 g/dL (ref 31.5–35.7)
MCV: 94 fL (ref 79–97)
Monocytes Absolute: 0.6 10*3/uL (ref 0.1–0.9)
Monocytes: 9 %
Neutrophils Absolute: 4.1 10*3/uL (ref 1.4–7.0)
Neutrophils: 58 %
Platelets: 204 10*3/uL (ref 150–450)
RBC: 4.14 x10E6/uL (ref 3.77–5.28)
RDW: 12.7 % (ref 11.7–15.4)
WBC: 7 10*3/uL (ref 3.4–10.8)

## 2019-11-24 LAB — LIPID PANEL
Chol/HDL Ratio: 4.3 ratio (ref 0.0–4.4)
Cholesterol, Total: 164 mg/dL (ref 100–199)
HDL: 38 mg/dL — ABNORMAL LOW (ref >39)
LDL Chol Calc (NIH): 111 mg/dL — ABNORMAL HIGH (ref 0–99)
Triglycerides: 78 mg/dL (ref 0–149)
VLDL Cholesterol Cal: 15 mg/dL (ref 5–40)

## 2019-11-24 LAB — TSH: TSH: 3.14 u[IU]/mL (ref 0.450–4.500)

## 2019-11-24 LAB — FSH/LH
FSH: 4.8 m[IU]/mL
LH: 12.8 m[IU]/mL

## 2019-11-24 LAB — TESTOSTERONE: Testosterone: 95 ng/dL — ABNORMAL HIGH (ref 13–71)

## 2019-11-25 ENCOUNTER — Ambulatory Visit (INDEPENDENT_AMBULATORY_CARE_PROVIDER_SITE_OTHER): Payer: Federal, State, Local not specified - PPO | Admitting: Family Medicine

## 2019-11-25 ENCOUNTER — Encounter: Payer: Self-pay | Admitting: Family Medicine

## 2019-11-25 ENCOUNTER — Other Ambulatory Visit: Payer: Self-pay

## 2019-11-25 VITALS — BP 116/50 | HR 59 | Temp 97.9°F | Ht 65.4 in | Wt 281.0 lb

## 2019-11-25 DIAGNOSIS — R0602 Shortness of breath: Secondary | ICD-10-CM

## 2019-11-25 DIAGNOSIS — F411 Generalized anxiety disorder: Secondary | ICD-10-CM

## 2019-11-25 DIAGNOSIS — H6591 Unspecified nonsuppurative otitis media, right ear: Secondary | ICD-10-CM

## 2019-11-25 DIAGNOSIS — L680 Hirsutism: Secondary | ICD-10-CM | POA: Diagnosis not present

## 2019-11-25 DIAGNOSIS — H6123 Impacted cerumen, bilateral: Secondary | ICD-10-CM

## 2019-11-25 DIAGNOSIS — E282 Polycystic ovarian syndrome: Secondary | ICD-10-CM | POA: Diagnosis not present

## 2019-11-25 DIAGNOSIS — N926 Irregular menstruation, unspecified: Secondary | ICD-10-CM | POA: Diagnosis not present

## 2019-11-25 DIAGNOSIS — Z09 Encounter for follow-up examination after completed treatment for conditions other than malignant neoplasm: Secondary | ICD-10-CM

## 2019-11-25 MED ORDER — AMOXICILLIN-POT CLAVULANATE 875-125 MG PO TABS: 1.0000 | ORAL_TABLET | Freq: Two times a day (BID) | ORAL | 0 refills | Status: AC

## 2019-11-25 NOTE — Progress Notes (Signed)
[name]    Established Patient Office Visit  Subjective:  Patient ID: Tanya Hanson, female    DOB: 1995/11/04  Age: 24 y.o. MRN: [id number]  CC:  Chief Complaint  Patient presents with  . Follow-up    lab results.   . Ear Pain    right side.     HPI Tanya Hanson is a 24 year old female who presents for Follow Up today.    Patient Active Problem List   Diagnosis Date Noted  . Intractable tension-type headache 03/23/2019  . Hirsutism 08/02/2018  . PCOS (polycystic ovarian syndrome) 08/02/2018  . Irregular periods/menstrual cycles 08/02/2018  . Class 3 severe obesity due to excess calories without serious comorbidity with body mass index (BMI) of 40.0 to 44.9 in adult [facility]) 08/02/2018  . Generalized anxiety disorder 08/02/2018    Past Medical History:  Diagnosis Date  . Generalized anxiety disorder   . Hirsutism   . Irregular menstrual cycle    Current Status: Since her last office visit, he is doing well with no complaints. Her anxiety is stable today. She denies suicidal ideations, homicidal ideations, or auditory hallucinations. She denies fevers, chills, fatigue, recent infections, weight loss, and night sweats. She has not had any headaches, visual changes, dizziness, and falls. No chest pain, heart palpitations, cough and shortness of breath reported. Denies GI problems such as nausea, vomiting, diarrhea, and constipation. She has no reports of blood in stools, dysuria and hematuria. She is taking all medications as prescribed. She denies pain today.   No past surgical history on file.  No family history on file.  Social History   Socioeconomic History  . Marital status: Single    Spouse name: Not on file  . Number of children: Not on file  . Years of education: Not on file  . Highest education level: Not on file  Occupational History  . Not on file  Tobacco Use  . Smoking status: Never Smoker    . Smokeless tobacco: Never Used  Substance and Sexual Activity  . Alcohol use: No  . Drug use: No  . Sexual activity: Not on file  Other Topics Concern  . Not on file  Social History Narrative  . Not on file   Social Determinants of Health   Financial Resource Strain:   . Difficulty of Paying Living Expenses:   Food Insecurity:   . Worried About Charity fundraiser in the Last Year:   . Arboriculturist in the Last Year:   Transportation Needs:   . Film/video editor (Medical):   Marland Kitchen Lack of Transportation (Non-Medical):   Physical Activity:   . Days of Exercise per Week:   . Minutes of Exercise per Session:   Stress:   . Feeling of Stress :   Social Connections:   . Frequency of Communication with Friends and Family:   . Frequency of Social Gatherings with Friends and Family:   . Attends Religious Services:   . Active Member of Clubs or Organizations:   . Attends Archivist Meetings:   Marland Kitchen Marital Status:   Intimate Partner Violence:   . Fear of Current or Ex-Partner:   . Emotionally Abused:   Marland Kitchen Physically Abused:   . Sexually Abused:     Outpatient Medications Prior to Visit  Medication Sig Dispense Refill  . sulfamethoxazole-trimethoprim (BACTRIM DS) 800-160 MG tablet Take 1 tablet by mouth 2 (two) times  daily. 14 tablet 0  . norethindrone-ethinyl estradiol (LOESTRIN FE) 1-20 MG-MCG tablet Take 1 tablet by mouth daily. (Patient not taking: Reported on 11/25/2019) 1 Package 11   No facility-administered medications prior to visit.    No Known Allergies  ROS Review of Systems  Constitutional: Negative.   HENT: Negative.   Eyes: Negative.   Respiratory: Negative.   Cardiovascular: Negative.   Gastrointestinal: Positive for abdominal distention.  Endocrine: Negative.   Genitourinary: Positive for menstrual problem (irregular).  Musculoskeletal: Negative.   Skin: Negative.   Allergic/Immunologic: Negative.   Neurological: Negative.    Hematological: Negative.   Psychiatric/Behavioral: Negative.       Objective:    Physical Exam Vitals and nursing note reviewed.  Constitutional:      Appearance: Normal appearance.  HENT:     Head: Normocephalic and atraumatic.     Nose: Nose normal.     Mouth/Throat:     Mouth: Mucous membranes are moist.  Cardiovascular:     Rate and Rhythm: Normal rate and regular rhythm.     Pulses: Normal pulses.     Heart sounds: Normal heart sounds.  Pulmonary:     Effort: Pulmonary effort is normal.     Breath sounds: Normal breath sounds.  Abdominal:     General: Bowel sounds are normal. There is distension (obese).     Palpations: Abdomen is soft.  Musculoskeletal:     Cervical back: Normal range of motion and neck supple.  Skin:    General: Skin is warm and dry.  Neurological:     General: No focal deficit present.     Mental Status: She is alert and oriented to person, place, and time.  Psychiatric:        Mood and Affect: Mood normal.        Behavior: Behavior normal.        Thought Content: Thought content normal.        Judgment: Judgment normal.     BP (!) 116/50 (BP Location: Left Arm, Patient Position: Sitting, Cuff Size: Large)   Pulse (!) 59   Temp 97.9 F (36.6 C)   Ht 5' 5.4" (1.661 m)   Wt 281 lb 0.2 oz (127.5 kg)   SpO2 100%   BMI 46.19 kg/m  Wt Readings from Last 3 Encounters:  11/25/19 281 lb 0.2 oz (127.5 kg)  11/09/19 278 lb 0.4 oz (126.1 kg)  03/22/19 281 lb 6.4 oz (127.6 kg)     Health Maintenance Due  Topic Date Due  . Hepatitis C Screening  Never done  . COVID-19 Vaccine (1) Never done  . HIV Screening  Never done  . PAP-Cervical Cytology Screening  Never done  . PAP SMEAR-Modifier  Never done    There are no preventive care reminders to display for this patient.  Lab Results  Component Value Date   TSH 3.140 11/23/2019   Lab Results  Component Value Date   WBC 7.0 11/23/2019   HGB 13.0 11/23/2019   HCT 39.1 11/23/2019    MCV 94 11/23/2019   PLT 204 11/23/2019   Lab Results  Component Value Date   NA 139 11/23/2019   K 4.3 11/23/2019   CO2 22 11/23/2019   GLUCOSE 87 11/23/2019   BUN 13 11/23/2019   CREATININE 0.95 11/23/2019   BILITOT <0.2 11/23/2019   ALKPHOS 84 11/23/2019   AST 14 11/23/2019   ALT 15 11/23/2019   PROT 6.3 11/23/2019   ALBUMIN 4.0 11/23/2019  CALCIUM 8.8 11/23/2019   ANIONGAP 7 05/28/2017   Lab Results  Component Value Date   CHOL 164 11/23/2019   Lab Results  Component Value Date   HDL 38 (L) 11/23/2019   Lab Results  Component Value Date   LDLCALC 111 (H) 11/23/2019   Lab Results  Component Value Date   TRIG 78 11/23/2019   Lab Results  Component Value Date   CHOLHDL 4.3 11/23/2019   Lab Results  Component Value Date   HGBA1C 5.0 08/02/2018      Assessment & Plan:   1. PCOS (polycystic ovarian syndrome) Previously referred her to GYN for further assessment. She will follow up soon.  - spironolactone (ALDACTONE) 25 MG tablet; Take 1 tablet (25 mg total) by mouth daily.  Dispense: 30 tablet; Refill: 6  2. Hirsutism We will initiate Spironolactone today.  - spironolactone (ALDACTONE) 25 MG tablet; Take 1 tablet (25 mg total) by mouth daily.  Dispense: 30 tablet; Refill: 6  3. Irregular periods/menstrual cycles  4. Shortness of breath Stable today. No signs or symptoms of respiratory distress noted or reported.   5. Generalized anxiety disorder - Ambulatory referral to Psychiatry  6. Bilateral impacted cerumen Earwax removed successfully with no complaints or concerns. Patient tolerated procedure well. Inner ear canal mild redness noted post procedure. Patient to place mineral oil onto cotton ball and insert into his ear canal weekly, to possibly prevent future incidents of earwax buildup.   7. Other nonsuppurative otitis media of right ear, unspecified chronicity We will initiate antibiotic today.  - amoxicillin-clavulanate (AUGMENTIN) 875-125 MG  tablet; Take 1 tablet by mouth 2 (two) times daily for 7 days.  Dispense: 14 tablet; Refill: 0  8. Follow up She will follow up in 3 months.    Meds ordered this encounter  Medications  . amoxicillin-clavulanate (AUGMENTIN) 875-125 MG tablet    Sig: Take 1 tablet by mouth 2 (two) times daily for 7 days.    Dispense:  14 tablet    Refill:  0  . spironolactone (ALDACTONE) 25 MG tablet    Sig: Take 1 tablet (25 mg total) by mouth daily.    Dispense:  30 tablet    Refill:  6    Orders Placed This Encounter  Procedures  . Ambulatory referral to Psychiatry     Referral Orders     Ambulatory referral to Psychiatry   Raliegh Ip,  MSN, FNP-BC [facility] [address] [place], [place] [address] [phone] fax   Problem List Items Addressed This Visit      Endocrine   PCOS (polycystic ovarian syndrome) - Primary   Relevant Medications   spironolactone (ALDACTONE) 25 MG tablet     Musculoskeletal and Integument   Hirsutism   Relevant Medications   spironolactone (ALDACTONE) 25 MG tablet     Other   Generalized anxiety disorder   Relevant Orders   Ambulatory referral to Psychiatry   Irregular periods/menstrual cycles    Other Visit Diagnoses    Shortness of breath       Bilateral impacted cerumen       Follow up       Other nonsuppurative otitis media of right ear, unspecified chronicity       Relevant Medications   amoxicillin-clavulanate (AUGMENTIN) 875-125 MG tablet      Meds ordered this encounter  Medications  . amoxicillin-clavulanate (AUGMENTIN) 875-125 MG tablet    Sig:  Take 1 tablet by mouth 2 (two) times daily for 7 days.    Dispense:  14 tablet    Refill:  0  . spironolactone (ALDACTONE) 25 MG tablet    Sig: Take 1 tablet (25 mg total) by mouth daily.    Dispense:  30 tablet    Refill:  6    Follow-up: Return in about 3 months (around  02/25/2020).    Kallie Locks, FNP

## 2019-11-28 ENCOUNTER — Encounter: Payer: Self-pay | Admitting: Family Medicine

## 2019-11-28 MED ORDER — SPIRONOLACTONE 25 MG PO TABS: 25.0000 mg | ORAL_TABLET | Freq: Every day | ORAL | 6 refills | Status: DC

## 2019-12-19 DIAGNOSIS — Z20822 Contact with and (suspected) exposure to covid-19: Secondary | ICD-10-CM | POA: Diagnosis not present

## 2019-12-19 DIAGNOSIS — K529 Noninfective gastroenteritis and colitis, unspecified: Secondary | ICD-10-CM | POA: Diagnosis not present

## 2020-01-09 ENCOUNTER — Ambulatory Visit: Payer: Federal, State, Local not specified - PPO | Admitting: Family Medicine

## 2020-01-23 DIAGNOSIS — Z20822 Contact with and (suspected) exposure to covid-19: Secondary | ICD-10-CM | POA: Diagnosis not present

## 2020-01-24 ENCOUNTER — Ambulatory Visit: Payer: Federal, State, Local not specified - PPO | Admitting: Obstetrics and Gynecology

## 2020-02-29 ENCOUNTER — Ambulatory Visit: Payer: Federal, State, Local not specified - PPO | Admitting: Family Medicine

## 2020-03-08 DIAGNOSIS — Z20822 Contact with and (suspected) exposure to covid-19: Secondary | ICD-10-CM | POA: Diagnosis not present

## 2020-03-09 ENCOUNTER — Ambulatory Visit: Payer: Federal, State, Local not specified - PPO | Admitting: Family Medicine

## 2020-04-23 ENCOUNTER — Telehealth: Payer: Self-pay | Admitting: Family Medicine

## 2020-04-23 NOTE — Telephone Encounter (Signed)
Pt was called about awv w/ pcc. lvm to return call  

## 2020-05-28 DIAGNOSIS — R051 Acute cough: Secondary | ICD-10-CM | POA: Diagnosis not present

## 2020-05-28 DIAGNOSIS — J209 Acute bronchitis, unspecified: Secondary | ICD-10-CM | POA: Diagnosis not present

## 2020-05-28 DIAGNOSIS — Z20822 Contact with and (suspected) exposure to covid-19: Secondary | ICD-10-CM | POA: Diagnosis not present

## 2020-05-31 DIAGNOSIS — R051 Acute cough: Secondary | ICD-10-CM | POA: Diagnosis not present

## 2020-05-31 DIAGNOSIS — J209 Acute bronchitis, unspecified: Secondary | ICD-10-CM | POA: Diagnosis not present

## 2020-05-31 DIAGNOSIS — Z20822 Contact with and (suspected) exposure to covid-19: Secondary | ICD-10-CM | POA: Diagnosis not present

## 2020-07-10 ENCOUNTER — Other Ambulatory Visit: Payer: Self-pay

## 2020-07-10 ENCOUNTER — Ambulatory Visit: Payer: Federal, State, Local not specified - PPO | Attending: Internal Medicine

## 2020-07-10 ENCOUNTER — Ambulatory Visit: Payer: Federal, State, Local not specified - PPO

## 2020-07-10 DIAGNOSIS — Z23 Encounter for immunization: Secondary | ICD-10-CM

## 2020-07-10 NOTE — Progress Notes (Signed)
   Covid-19 Vaccination Clinic  Name:  Tanya Hanson    MRN: [id number] DOB: [birth year]  07/10/2020  [name] was observed post Covid-19 immunization for 15 minutes without incident. She was provided with Vaccine Information Sheet and instruction to access the V-Safe system.   [name] was instructed to call 911 with any severe reactions post vaccine: Marland Kitchen Difficulty breathing  . Swelling of face and throat  . A fast heartbeat  . A bad rash all over body  . Dizziness and weakness   Immunizations Administered    Name Date Dose VIS Date Route   PFIZER Comrnaty(Gray TOP) Covid-19 Vaccine 07/10/2020  3:22 PM 0.3 mL 05/10/2020 Intramuscular   Manufacturer: [employer], [employer]   Lot: [id number]   NDC: [phone]

## 2020-07-11 ENCOUNTER — Ambulatory Visit: Payer: Federal, State, Local not specified - PPO

## 2021-02-11 ENCOUNTER — Encounter (HOSPITAL_COMMUNITY): Payer: Self-pay

## 2021-02-11 ENCOUNTER — Other Ambulatory Visit: Payer: Self-pay

## 2021-02-11 ENCOUNTER — Emergency Department (HOSPITAL_COMMUNITY): Payer: Federal, State, Local not specified - PPO

## 2021-02-11 ENCOUNTER — Emergency Department (HOSPITAL_COMMUNITY)
Admission: EM | Admit: 2021-02-11 | Discharge: 2021-02-11 | Disposition: A | Discharge status: Home / Self Care | Payer: Federal, State, Local not specified - PPO | Attending: Emergency Medicine | Admitting: Emergency Medicine

## 2021-02-11 DIAGNOSIS — M25531 Pain in right wrist: Secondary | ICD-10-CM | POA: Diagnosis not present

## 2021-02-11 MED ORDER — NAPROXEN 500 MG PO TABS: 500.0000 mg | ORAL_TABLET | Freq: Two times a day (BID) | ORAL | 0 refills | Status: DC

## 2021-02-11 NOTE — ED Triage Notes (Addendum)
Patient c/o right wrist pain x 1 week. Patient reports that she fractured the same wrist when she was younger. Pain is worse with movement. Patient denies any known injuries.

## 2021-02-11 NOTE — ED Provider Notes (Signed)
[facility]-EMERGENCY DEPT Provider Note   CSN: [id number] Arrival date & time: 02/11/21  1815     History Chief Complaint  Patient presents with   Wrist Pain    Tanya Hanson is a 25 y.o. female presenting for evaluation for evaluation of right wrist pain.  Patient states in the past week, she has had pain in her right wrist.  She does hair and works at [employer], finds repetitive motion makes her wrist worse.  She has tried Tylenol and ibuprofen without improvement of symptoms.  She denies fall, trauma, or injury.  No numbness or tingling.  Pain is mostly in the dorsal aspect of her wrist as well as her thumb and first finger.  She broke her wrist as a kid, but no injury recently.  HPI     Past Medical History:  Diagnosis Date   Generalized anxiety disorder    Hirsutism    Irregular menstrual cycle    PCOS (polycystic ovarian syndrome)     Patient Active Problem List   Diagnosis Date Noted   Intractable tension-type headache 03/23/2019   Hirsutism 08/02/2018   PCOS (polycystic ovarian syndrome) 08/02/2018   Irregular periods/menstrual cycles 08/02/2018   Class 3 severe obesity due to excess calories without serious comorbidity with body mass index (BMI) of 40.0 to 44.9 in adult [facility]) 08/02/2018   Generalized anxiety disorder 08/02/2018    History reviewed. No pertinent surgical history.   OB History   No obstetric history on file.     Family History  Problem Relation Age of Onset   Gout Father     Social History   Tobacco Use   Smoking status: Never   Smokeless tobacco: Never  Vaping Use   Vaping Use: Never used  Substance Use Topics   Alcohol use: No   Drug use: No    Home Medications Prior to Admission medications   Medication Sig Start Date End Date Taking? Authorizing Provider  naproxen (NAPROSYN) 500 MG tablet Take 1 tablet (500 mg total) by mouth 2 (two) times daily with a meal. 02/11/21  Yes Carlethia Mesquita,  PA-C  norethindrone-ethinyl estradiol (LOESTRIN FE) 1-20 MG-MCG tablet Take 1 tablet by mouth daily. Patient not taking: Reported on 11/25/2019 03/22/19   Kallie Locks, FNP  spironolactone (ALDACTONE) 25 MG tablet Take 1 tablet (25 mg total) by mouth daily. 11/28/19   Kallie Locks, FNP    Allergies    Patient has no known allergies.  Review of Systems   Review of Systems  Musculoskeletal:  Positive for arthralgias.  Neurological:  Negative for numbness.   Physical Exam Updated Vital Signs BP (!) 137/94 (BP Location: Left Arm)   Pulse 84   Temp 98.2 F (36.8 C) (Oral)   Resp 16   Ht 5' 4.5" (1.638 m)   Wt 104.3 kg   SpO2 99%   BMI 38.87 kg/m   Physical Exam Vitals and nursing note reviewed.  Constitutional:      General: She is not in acute distress.    Appearance: She is well-developed.  HENT:     Head: Normocephalic and atraumatic.  Eyes:     Extraocular Movements: Extraocular movements intact.  Cardiovascular:     Rate and Rhythm: Normal rate.  Pulmonary:     Effort: Pulmonary effort is normal.  Abdominal:     General: There is no distension.  Musculoskeletal:        General: Tenderness present. Normal range  of motion.     Cervical back: Normal range of motion.     Comments: Mild swelling of the right wrist when compared to the left.  No erythema or warmth.  Full active range of motion of the wrist, increased pain with palmar flexion.  Patient able to fully extend and make a fist with her right hand.  Increased pain over the dorsum of the first and second metacarpal and the radial wrist. Good distal sensation and cap refill. Negative tinnels. Positive Finkelstein.   Skin:    General: Skin is warm.     Findings: No rash.  Neurological:     Mental Status: She is alert and oriented to person, place, and time.    ED Results / Procedures / Treatments   Labs (all labs ordered are listed, but only abnormal results are displayed) Labs Reviewed - No data to  display  EKG None  Radiology No results found.  Procedures Procedures   Medications Ordered in ED Medications - No data to display  ED Course  I have reviewed the triage vital signs and the nursing notes.  Pertinent labs & imaging results that were available during my care of the patient were reviewed by me and considered in my medical decision making (see chart for details).    MDM Rules/Calculators/A&P                           Patient resenting for evaluation of right wrist pain.  On exam, patient is nontoxic.  She is neurovascular intact.  No fall or injury, doubt fracture dislocation.  Discussed with patient.  Discussed likely musculoskeletal cause.  Offered x-ray, patient declined.  Will place in splint and have her take regular anti-inflammatories.  Follow-up with hand if symptoms not improving.  At this time, patient appears safe for discharge.  Return precautions given.  Patient states she understands and agrees to plan.   Final Clinical Impression(s) / ED Diagnoses Final diagnoses:  Right wrist pain    Rx / DC Orders ED Discharge Orders          Ordered    naproxen (NAPROSYN) 500 MG tablet  2 times daily with meals        02/11/21 1854             Alveria Apley, PA-C 02/11/21 1901    Pricilla Loveless, MD 02/11/21 1944

## 2021-02-11 NOTE — Discharge Instructions (Addendum)
Take naproxen 2 times a day with meals.  Do not take other anti-inflammatories at the same time (Advil, Motrin, ibuprofen, Aleve). You may supplement with Tylenol if you need further pain control. Use ice packs to help with pain and swelling. Use the wrist brace, especially if you are going to be doing repetitive motions with your wrist and at night. If your symptoms not improved in the next week, follow-up with a hand doctor listed below for further evaluation. Return to the emergency room with any new, worsening, concerning symptoms

## 2021-04-02 ENCOUNTER — Ambulatory Visit: Payer: Federal, State, Local not specified - PPO | Admitting: Nurse Practitioner

## 2021-04-02 ENCOUNTER — Encounter: Payer: Self-pay | Admitting: Nurse Practitioner

## 2021-04-02 ENCOUNTER — Other Ambulatory Visit: Payer: Self-pay

## 2021-04-02 VITALS — BP 141/91 | HR 93 | Temp 98.1°F | Ht 65.4 in | Wt 318.0 lb

## 2021-04-02 DIAGNOSIS — Z23 Encounter for immunization: Secondary | ICD-10-CM | POA: Diagnosis not present

## 2021-04-02 DIAGNOSIS — R635 Abnormal weight gain: Secondary | ICD-10-CM | POA: Diagnosis not present

## 2021-04-02 DIAGNOSIS — Z0000 Encounter for general adult medical examination without abnormal findings: Secondary | ICD-10-CM | POA: Diagnosis not present

## 2021-04-02 DIAGNOSIS — N912 Amenorrhea, unspecified: Secondary | ICD-10-CM

## 2021-04-02 NOTE — Patient Instructions (Addendum)
You were seen today in the [facility] for wellness check. Labs were collected and [place] resulted, results will be available via MyChart or, if abnormal, you will be contacted by clinic staff. You were prescribed medications, please take as directed. Please follow up in 1 mth for pap smear. Referral sent to gynecology, they will call you with an appointment.

## 2021-04-02 NOTE — Progress Notes (Signed)
[facility] Patient [facility] [address] [place], [place]  [address] Phone:  [phone]   Fax:  [phone] Subjective:   Patient ID: Tanya Hanson, female    DOB: [birth year], 25 y.o.   MRN: [id number]  Chief Complaint  Patient presents with   Follow-up    Needing ultrasound and referral to Gyn for PCOS.    HPI Tanya Hanson 25 y.o. female with history of obesity to the [facility] for wellness exam. Patient states that when she was seen in the clinic she was diagnosed with PCOS, with pending referral to gynecology and completion of [place]. Denies any receiving follow up on referral and/US, requesting completion of both today. States that she has not had a menstrual cycle in greater than 1 yr. Denies any other associated symptoms, including vaginal discharge or discomfort.  Patient also concerned about chronic fluctuations in weight, with recent weight gain, despite managing diet and exercising weekly. Admits feeling frustrated with no progress in weight loss.  Denies any other complaints today. Endorses having one partner in the past 6 mths, bi-sexual. 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 Denies taking any supplements or medications. Denies any drugs or other substance abuse. Denies any fever. Denies any fatigue, chest pain, shortness of breath, HA or dizziness. Denies any blurred vision, numbness or tingling. Denies any abdominal pain, nausea/ vomiting.    Past Medical History:  Diagnosis Date   Generalized anxiety disorder    Hirsutism    Irregular menstrual cycle    PCOS (polycystic ovarian syndrome)     No past surgical history on file.  Family History  Problem Relation Age of Onset   Gout Father     Social History   Socioeconomic History   Marital status: Single    Spouse name: Not on file   Number of children: Not on file   Years of education: Not on file   Highest education level: Not on file  Occupational History   Not on file  Tobacco Use    Smoking status: Never   Smokeless tobacco: Never  Vaping Use   Vaping Use: Never used  Substance and Sexual Activity   Alcohol use: No   Drug use: No   Sexual activity: Yes    Birth control/protection: None  Other Topics Concern   Not on file  Social History Narrative   Not on file   Social Determinants of Health   Financial Resource Strain: Not on file  Food Insecurity: Not on file  Transportation Needs: Not on file  Physical Activity: Not on file  Stress: Not on file  Social Connections: Not on file  Intimate Partner Violence: Not on file    Outpatient Medications Prior to Visit  Medication Sig Dispense Refill   naproxen (NAPROSYN) 500 MG tablet Take 1 tablet (500 mg total) by mouth 2 (two) times daily with a meal. 20 tablet 0   norethindrone-ethinyl estradiol (LOESTRIN FE) 1-20 MG-MCG tablet Take 1 tablet by mouth daily. (Patient not taking: Reported on 11/25/2019) 1 Package 11   spironolactone (ALDACTONE) 25 MG tablet Take 1 tablet (25 mg total) by mouth daily. 30 tablet 6   No facility-administered medications prior to visit.    No Known Allergies  Review of Systems  Constitutional:  Negative for chills, fever and malaise/fatigue.  HENT: Negative.    Eyes: Negative.   Respiratory:  Negative for cough and shortness of breath.   Cardiovascular:  Negative for chest pain, palpitations and  leg swelling.  Gastrointestinal:  Negative for abdominal pain, blood in stool, constipation, diarrhea, nausea and vomiting.  Genitourinary:        See HPI  Musculoskeletal: Negative.   Skin: Negative.   Neurological: Negative.   Psychiatric/Behavioral:  Negative for depression. The patient is not nervous/anxious.   All other systems reviewed and are negative.     Objective:    Physical Exam Vitals reviewed.  Constitutional:      General: She is not in acute distress.    Appearance: Normal appearance. She is obese.  HENT:     Head: Normocephalic.     Right Ear: Tympanic  membrane, ear canal and external ear normal.     Left Ear: Tympanic membrane, ear canal and external ear normal.     Nose: Nose normal.     Mouth/Throat:     Mouth: Mucous membranes are moist.     Pharynx: Oropharynx is clear.  Eyes:     Extraocular Movements: Extraocular movements intact.     Conjunctiva/sclera: Conjunctivae normal.     Pupils: Pupils are equal, round, and reactive to light.  Cardiovascular:     Rate and Rhythm: Normal rate and regular rhythm.     Pulses: Normal pulses.     Heart sounds: Normal heart sounds.     Comments: No obvious peripheral edema Pulmonary:     Effort: Pulmonary effort is normal.     Breath sounds: Normal breath sounds.  Abdominal:     General: Abdomen is flat. Bowel sounds are normal.     Palpations: Abdomen is soft.  Musculoskeletal:        General: Normal range of motion.     Cervical back: Normal range of motion and neck supple.  Skin:    General: Skin is warm and dry.     Capillary Refill: Capillary refill takes less than 2 seconds.  Neurological:     General: No focal deficit present.     Mental Status: She is alert and oriented to person, place, and time.  Psychiatric:        Mood and Affect: Mood normal.        Behavior: Behavior normal.        Thought Content: Thought content normal.        Judgment: Judgment normal.    BP (!) 141/91 (BP Location: Right Arm, Patient Position: Sitting)   Pulse 93   Temp 98.1 F (36.7 C)   Ht 5' 5.4" (1.661 m)   Wt (!) 318 lb 0.2 oz (144.2 kg)   SpO2 97%   BMI 52.27 kg/m  Wt Readings from Last 3 Encounters:  04/02/21 (!) 318 lb 0.2 oz (144.2 kg)  02/11/21 230 lb (104.3 kg)  11/25/19 281 lb 0.2 oz (127.5 kg)    Immunization History  Administered Date(s) Administered   Influenza,inj,Quad PF,6+ Mos 08/02/2018, 03/22/2019, 04/02/2021   PFIZER Comirnaty(Gray Top)Covid-19 Tri-Sucrose Vaccine 07/10/2020   PFIZER(Purple Top)SARS-COV-2 Vaccination 07/31/2020, 10/03/2020   Tdap 08/02/2018     Diabetic Foot Exam - Simple   No data filed     Lab Results  Component Value Date   TSH 3.140 11/23/2019   Lab Results  Component Value Date   WBC 7.0 11/23/2019   HGB 13.0 11/23/2019   HCT 39.1 11/23/2019   MCV 94 11/23/2019   PLT 204 11/23/2019   Lab Results  Component Value Date   NA 139 11/23/2019   K 4.3 11/23/2019   CO2 22 11/23/2019   GLUCOSE  87 11/23/2019   BUN 13 11/23/2019   CREATININE 0.95 11/23/2019   BILITOT <0.2 11/23/2019   ALKPHOS 84 11/23/2019   AST 14 11/23/2019   ALT 15 11/23/2019   PROT 6.3 11/23/2019   ALBUMIN 4.0 11/23/2019   CALCIUM 8.8 11/23/2019   ANIONGAP 7 05/28/2017   Lab Results  Component Value Date   CHOL 164 11/23/2019   CHOL 186 08/02/2018   Lab Results  Component Value Date   HDL 38 (L) 11/23/2019   HDL 37 (L) 08/02/2018   Lab Results  Component Value Date   LDLCALC 111 (H) 11/23/2019   LDLCALC 125 (H) 08/02/2018   Lab Results  Component Value Date   TRIG 78 11/23/2019   TRIG 121 08/02/2018   Lab Results  Component Value Date   CHOLHDL 4.3 11/23/2019   CHOLHDL 5.0 (H) 08/02/2018   Lab Results  Component Value Date   HGBA1C 5.0 08/02/2018       Assessment & Plan:   Problem List Items Addressed This Visit   None Visit Diagnoses     Healthcare maintenance    -  Primary   Relevant Orders   CBC with Differential/Platelet   Comprehensive metabolic panel   Lipid panel   Flu Vaccine QUAD 76mo+IM (Fluarix, Fluzone & Alfiuria Quad PF) (Completed)   HIV antibody (with reflex) Encouraged continued diet and exercise efforts  Discussed safe sex practices Patient endorsed completing SBE, encouraged continued screening    Weight gain       Relevant Orders   TSH+T4F+T3Free   Amenorrhea       Relevant Orders   US Transvaginal Non-OB   Ambulatory referral to Gynecology   Follow up in 1 month for Pap smear, sooner as needed    I have discontinued Advika Q. Holck's norethindrone-ethinyl estradiol-FE,  spironolactone, and naproxen.  No orders of the defined types were placed in this encounter.    Kathrynn Speed, NP

## 2021-04-03 ENCOUNTER — Telehealth: Payer: Self-pay

## 2021-04-03 LAB — CBC WITH DIFFERENTIAL/PLATELET
Basophils Absolute: 0 10*3/uL (ref 0.0–0.2)
Basos: 0 %
EOS (ABSOLUTE): 0.1 10*3/uL (ref 0.0–0.4)
Eos: 1 %
Hematocrit: 41.6 % (ref 34.0–46.6)
Hemoglobin: 13.8 g/dL (ref 11.1–15.9)
Immature Grans (Abs): 0 10*3/uL (ref 0.0–0.1)
Immature Granulocytes: 0 %
Lymphocytes Absolute: 2.2 10*3/uL (ref 0.7–3.1)
Lymphs: 29 %
MCH: 30.7 pg (ref 26.6–33.0)
MCHC: 33.2 g/dL (ref 31.5–35.7)
MCV: 93 fL (ref 79–97)
Monocytes Absolute: 0.5 10*3/uL (ref 0.1–0.9)
Monocytes: 7 %
Neutrophils Absolute: 4.8 10*3/uL (ref 1.4–7.0)
Neutrophils: 63 %
Platelets: 238 10*3/uL (ref 150–450)
RBC: 4.49 x10E6/uL (ref 3.77–5.28)
RDW: 12.5 % (ref 11.7–15.4)
WBC: 7.6 10*3/uL (ref 3.4–10.8)

## 2021-04-03 LAB — COMPREHENSIVE METABOLIC PANEL
ALT: 29 IU/L (ref 0–32)
AST: 23 IU/L (ref 0–40)
Albumin/Globulin Ratio: 2 (ref 1.2–2.2)
Albumin: 4.5 g/dL (ref 3.9–5.0)
Alkaline Phosphatase: 94 IU/L (ref 44–121)
BUN/Creatinine Ratio: 9 (ref 9–23)
BUN: 8 mg/dL (ref 6–20)
Bilirubin Total: <0.2 mg/dL (ref 0.0–1.2)
CO2: 24 mmol/L (ref 20–29)
Calcium: 9.4 mg/dL (ref 8.7–10.2)
Chloride: 103 mmol/L (ref 96–106)
Creatinine, Ser: 0.92 mg/dL (ref 0.57–1.00)
Globulin, Total: 2.2 g/dL (ref 1.5–4.5)
Glucose: 89 mg/dL (ref 70–99)
Potassium: 4.3 mmol/L (ref 3.5–5.2)
Sodium: 139 mmol/L (ref 134–144)
Total Protein: 6.7 g/dL (ref 6.0–8.5)
eGFR: 90 mL/min/{1.73_m2} (ref >59)

## 2021-04-03 LAB — TSH+T4F+T3FREE
Free T4: 0.98 ng/dL (ref 0.82–1.77)
T3, Free: 3.1 pg/mL (ref 2.0–4.4)
TSH: 4.94 u[IU]/mL — ABNORMAL HIGH (ref 0.450–4.500)

## 2021-04-03 LAB — LIPID PANEL
Chol/HDL Ratio: 4.8 ratio — ABNORMAL HIGH (ref 0.0–4.4)
Cholesterol, Total: 190 mg/dL (ref 100–199)
HDL: 40 mg/dL (ref >39)
LDL Chol Calc (NIH): 134 mg/dL — ABNORMAL HIGH (ref 0–99)
Triglycerides: 88 mg/dL (ref 0–149)
VLDL Cholesterol Cal: 16 mg/dL (ref 5–40)

## 2021-04-03 LAB — HIV ANTIBODY (ROUTINE TESTING W REFLEX): HIV Screen 4th Generation wRfx: NONREACTIVE

## 2021-04-03 NOTE — Telephone Encounter (Signed)
Patient is scheduled for 04/05/21

## 2021-04-03 NOTE — Telephone Encounter (Signed)
[facility] referring for amenorrhea. Called and left voicemail for patient to call back to be scheduled.

## 2021-04-04 ENCOUNTER — Other Ambulatory Visit: Payer: Self-pay | Admitting: Nurse Practitioner

## 2021-04-04 DIAGNOSIS — E038 Other specified hypothyroidism: Secondary | ICD-10-CM

## 2021-04-04 MED ORDER — LEVOTHYROXINE SODIUM 50 MCG PO TABS: 50.0000 ug | ORAL_TABLET | Freq: Every day | ORAL | 0 refills | Status: DC

## 2021-04-05 ENCOUNTER — Ambulatory Visit (INDEPENDENT_AMBULATORY_CARE_PROVIDER_SITE_OTHER): Payer: Federal, State, Local not specified - PPO | Admitting: Obstetrics and Gynecology

## 2021-04-09 ENCOUNTER — Ambulatory Visit (HOSPITAL_COMMUNITY): Admission: RE | Admit: 2021-04-09 | Payer: Federal, State, Local not specified - PPO | Source: Ambulatory Visit

## 2021-04-18 ENCOUNTER — Encounter (HOSPITAL_COMMUNITY): Payer: Self-pay

## 2021-04-18 ENCOUNTER — Ambulatory Visit (HOSPITAL_COMMUNITY): Admission: RE | Admit: 2021-04-18 | Payer: Federal, State, Local not specified - PPO | Source: Ambulatory Visit

## 2021-04-29 ENCOUNTER — Ambulatory Visit (HOSPITAL_COMMUNITY): Admission: RE | Admit: 2021-04-29 | Payer: Federal, State, Local not specified - PPO | Source: Ambulatory Visit

## 2021-05-03 ENCOUNTER — Encounter: Payer: Self-pay | Admitting: Nurse Practitioner

## 2021-05-03 ENCOUNTER — Ambulatory Visit: Payer: Federal, State, Local not specified - PPO | Admitting: Nurse Practitioner

## 2021-05-03 ENCOUNTER — Encounter: Payer: Federal, State, Local not specified - PPO | Admitting: Obstetrics and Gynecology

## 2021-05-03 ENCOUNTER — Other Ambulatory Visit: Payer: Self-pay

## 2021-05-03 VITALS — BP 140/78 | HR 85 | Temp 97.3°F | Ht 65.4 in | Wt 321.0 lb

## 2021-05-03 DIAGNOSIS — Z01419 Encounter for gynecological examination (general) (routine) without abnormal findings: Secondary | ICD-10-CM | POA: Diagnosis not present

## 2021-05-03 NOTE — Progress Notes (Signed)
[facility] [place], [place]  [address] Phone:  [phone]   Fax:  [phone] Subjective:   Patient ID: Tanya Hanson, female    DOB: April 04, 1996, 25 y.o.   MRN: [id number]  Chief Complaint  Patient presents with   Gynecologic Exam    Tanya Hanson 25 y.o. female  has a past medical history of Generalized anxiety disorder, Hirsutism, Irregular menstrual cycle, and PCOS (polycystic ovarian syndrome). To the [facility] for women's annual wellness visit. Patient has had one partner in the past year, homosexual preference.  Denies any vaginal discomfort/ complaints today. Completes self breast exam regularly. Denies any breast discomfort or complaints today. States that her paternal aunts, grandmother and great grandmother have all had breast cancer.  Denies any other complaints today. Denies any fever. Denies any fatigue, chest pain, shortness of breath, HA or dizziness. Denies any blurred vision, numbness or tingling.   Past Medical History:  Diagnosis Date   Generalized anxiety disorder    Hirsutism    Irregular menstrual cycle    PCOS (polycystic ovarian syndrome)     No past surgical history on file.  Family History  Problem Relation Age of Onset   Gout Father     Social History   Socioeconomic History   Marital status: Single    Spouse name: Not on file   Number of children: Not on file   Years of education: Not on file   Highest education level: Not on file  Occupational History   Not on file  Tobacco Use   Smoking status: Never   Smokeless tobacco: Never  Vaping Use   Vaping Use: Never used  Substance and Sexual Activity   Alcohol use: No   Drug use: No   Sexual activity: Yes    Birth control/protection: None  Other Topics Concern   Not on file  Social History Narrative   Not on file   Social Determinants of Health   Financial Resource Strain: Not on file  Food Insecurity: Not on file  Transportation  Needs: Not on file  Physical Activity: Not on file  Stress: Not on file  Social Connections: Not on file  Intimate Partner Violence: Not on file    Outpatient Medications Prior to Visit  Medication Sig Dispense Refill   levothyroxine (SYNTHROID) 50 MCG tablet Take 1 tablet (50 mcg total) by mouth daily. 50 tablet 0   No facility-administered medications prior to visit.    No Known Allergies  Review of Systems  Constitutional:  Negative for chills, fever and malaise/fatigue.  Respiratory:  Negative for cough and shortness of breath.   Cardiovascular:  Negative for chest pain, palpitations and leg swelling.  Gastrointestinal:  Negative for abdominal pain, blood in stool, constipation, diarrhea, nausea and vomiting.  Genitourinary: Negative.   Skin: Negative.   Psychiatric/Behavioral:  Negative for depression. The patient is not nervous/anxious.   All other systems reviewed and are negative.     Objective:    Physical Exam Vitals reviewed. Exam conducted with a chaperone present.  Constitutional:      General: She is not in acute distress.    Appearance: Normal appearance. She is obese.  HENT:     Head: Normocephalic.  Cardiovascular:     Rate and Rhythm: Normal rate and regular rhythm.     Pulses: Normal pulses.     Heart sounds: Normal heart sounds.     Comments: No obvious peripheral edema Pulmonary:  Effort: Pulmonary effort is normal.     Breath sounds: Normal breath sounds.  Chest:  Breasts:    Right: Normal. No swelling, bleeding, inverted nipple, mass, nipple discharge, skin change or tenderness.     Left: Normal. No swelling, bleeding, inverted nipple, mass, nipple discharge, skin change or tenderness.  Genitourinary:    Vagina: Normal.     Cervix: Normal.     Uterus: Normal.      Adnexa: Right adnexa normal.  Skin:    General: Skin is warm and dry.     Capillary Refill: Capillary refill takes less than 2 seconds.  Neurological:     General: No focal  deficit present.     Mental Status: She is alert and oriented to person, place, and time.  Psychiatric:        Mood and Affect: Mood normal.        Behavior: Behavior normal.        Thought Content: Thought content normal.        Judgment: Judgment normal.    BP 140/78   Pulse 85   Temp (!) 97.3 F (36.3 C)   Ht 5' 5.4" (1.661 m)   Wt (!) 321 lb 0.4 oz (145.6 kg)   SpO2 96%   BMI 52.77 kg/m  Wt Readings from Last 3 Encounters:  05/03/21 (!) 321 lb 0.4 oz (145.6 kg)  04/02/21 (!) 318 lb 0.2 oz (144.2 kg)  02/11/21 230 lb (104.3 kg)    Immunization History  Administered Date(s) Administered   Influenza,inj,Quad PF,6+ Mos 08/02/2018, 03/22/2019, 04/02/2021   PFIZER Comirnaty(Gray Top)Covid-19 Tri-Sucrose Vaccine 07/10/2020   PFIZER(Purple Top)SARS-COV-2 Vaccination 07/31/2020, 10/03/2020   Tdap 08/02/2018    Diabetic Foot Exam - Simple   No data filed     Lab Results  Component Value Date   TSH 4.940 (H) 04/02/2021   Lab Results  Component Value Date   WBC 7.6 04/02/2021   HGB 13.8 04/02/2021   HCT 41.6 04/02/2021   MCV 93 04/02/2021   PLT 238 04/02/2021   Lab Results  Component Value Date   NA 139 04/02/2021   K 4.3 04/02/2021   CO2 24 04/02/2021   GLUCOSE 89 04/02/2021   BUN 8 04/02/2021   CREATININE 0.92 04/02/2021   BILITOT <0.2 04/02/2021   ALKPHOS 94 04/02/2021   AST 23 04/02/2021   ALT 29 04/02/2021   PROT 6.7 04/02/2021   ALBUMIN 4.5 04/02/2021   CALCIUM 9.4 04/02/2021   ANIONGAP 7 05/28/2017   EGFR 90 04/02/2021   Lab Results  Component Value Date   CHOL 190 04/02/2021   CHOL 164 11/23/2019   CHOL 186 08/02/2018   Lab Results  Component Value Date   HDL 40 04/02/2021   HDL 38 (L) 11/23/2019   HDL 37 (L) 08/02/2018   Lab Results  Component Value Date   LDLCALC 134 (H) 04/02/2021   LDLCALC 111 (H) 11/23/2019   LDLCALC 125 (H) 08/02/2018   Lab Results  Component Value Date   TRIG 88 04/02/2021   TRIG 78 11/23/2019   TRIG 121  08/02/2018   Lab Results  Component Value Date   CHOLHDL 4.8 (H) 04/02/2021   CHOLHDL 4.3 11/23/2019   CHOLHDL 5.0 (H) 08/02/2018   Lab Results  Component Value Date   HGBA1C 5.0 08/02/2018       Assessment & Plan:   Problem List Items Addressed This Visit   None Visit Diagnoses     Women's annual routine gynecological  examination    -  Primary   Relevant Orders   Pap IG and Chlamydia/Gonococcus, NAA ([facility])   NuSwab Vaginitis Plus (VG+) Encouraged continued self breast exams Encouraged continued diet and exercise efforts   Follow up in 6 mths for weight loss, sooner as needed    I am having Nestor Lewandowsky maintain her levothyroxine.  No orders of the defined types were placed in this encounter.    Teena Dunk, NP

## 2021-05-03 NOTE — Patient Instructions (Signed)
You were seen today in the [facility] for women's annual wellness visit. Labs were collected, results will be available via MyChart or, if abnormal, you will be contacted by clinic staff. Please follow up in 6 mths  for weight loss.

## 2021-05-06 LAB — PAP IG AND CT-NG NAA
Chlamydia, Nuc. Acid Amp: NEGATIVE
Gonococcus by Nucleic Acid Amp: NEGATIVE

## 2021-05-08 ENCOUNTER — Other Ambulatory Visit: Payer: Self-pay | Admitting: Nurse Practitioner

## 2021-05-08 DIAGNOSIS — N760 Acute vaginitis: Secondary | ICD-10-CM

## 2021-05-08 DIAGNOSIS — B9689 Other specified bacterial agents as the cause of diseases classified elsewhere: Secondary | ICD-10-CM

## 2021-05-08 MED ORDER — METRONIDAZOLE 500 MG PO TABS: 500.0000 mg | ORAL_TABLET | Freq: Two times a day (BID) | ORAL | 0 refills | Status: AC

## 2021-05-09 LAB — NUSWAB VAGINITIS PLUS (VG+)
Atopobium vaginae: HIGH Score — AB
BVAB 2: HIGH Score — AB
Candida albicans, NAA: NEGATIVE
Candida glabrata, NAA: NEGATIVE
Chlamydia trachomatis, NAA: NEGATIVE
Megasphaera 1: HIGH Score — AB
Neisseria gonorrhoeae, NAA: NEGATIVE
Trich vag by NAA: NEGATIVE

## 2021-05-20 ENCOUNTER — Other Ambulatory Visit: Payer: Self-pay | Admitting: Nurse Practitioner

## 2021-05-20 ENCOUNTER — Other Ambulatory Visit: Payer: Self-pay

## 2021-05-20 ENCOUNTER — Other Ambulatory Visit: Payer: Federal, State, Local not specified - PPO

## 2021-05-20 DIAGNOSIS — E039 Hypothyroidism, unspecified: Secondary | ICD-10-CM

## 2021-05-21 ENCOUNTER — Other Ambulatory Visit: Payer: Self-pay | Admitting: Nurse Practitioner

## 2021-05-21 DIAGNOSIS — E038 Other specified hypothyroidism: Secondary | ICD-10-CM

## 2021-05-21 LAB — TSH+T4F+T3FREE
Free T4: 1.03 ng/dL (ref 0.82–1.77)
T3, Free: 2.9 pg/mL (ref 2.0–4.4)
TSH: 4.43 u[IU]/mL (ref 0.450–4.500)

## 2021-05-21 MED ORDER — LEVOTHYROXINE SODIUM 50 MCG PO TABS: 50.0000 ug | ORAL_TABLET | Freq: Every day | ORAL | 0 refills | Status: DC

## 2021-07-17 ENCOUNTER — Other Ambulatory Visit: Payer: Federal, State, Local not specified - PPO | Admitting: Nurse Practitioner

## 2021-07-30 ENCOUNTER — Encounter: Payer: Self-pay | Admitting: Nurse Practitioner

## 2021-07-30 ENCOUNTER — Other Ambulatory Visit: Payer: Self-pay

## 2021-07-30 ENCOUNTER — Other Ambulatory Visit: Payer: Federal, State, Local not specified - PPO

## 2021-07-30 ENCOUNTER — Ambulatory Visit: Payer: Federal, State, Local not specified - PPO | Admitting: Nurse Practitioner

## 2021-07-30 VITALS — BP 117/87 | HR 92 | Temp 98.4°F | Ht 65.4 in | Wt 314.0 lb

## 2021-07-30 DIAGNOSIS — J020 Streptococcal pharyngitis: Secondary | ICD-10-CM

## 2021-07-30 DIAGNOSIS — J302 Other seasonal allergic rhinitis: Secondary | ICD-10-CM

## 2021-07-30 DIAGNOSIS — E039 Hypothyroidism, unspecified: Secondary | ICD-10-CM

## 2021-07-30 DIAGNOSIS — R070 Pain in throat: Secondary | ICD-10-CM

## 2021-07-30 DIAGNOSIS — E038 Other specified hypothyroidism: Secondary | ICD-10-CM

## 2021-07-30 LAB — POCT RAPID STREP A (OFFICE): Rapid Strep A Screen: POSITIVE — AB

## 2021-07-30 MED ORDER — PENICILLIN V POTASSIUM 500 MG PO TABS: 500.0000 mg | ORAL_TABLET | Freq: Three times a day (TID) | ORAL | 0 refills | Status: AC

## 2021-07-30 MED ORDER — CETIRIZINE-PSEUDOEPHEDRINE ER 5-120 MG PO TB12: 1.0000 | ORAL_TABLET | Freq: Two times a day (BID) | ORAL | 2 refills | Status: AC

## 2021-07-30 NOTE — Patient Instructions (Signed)
You were seen today in the [facility] for throat pain and thyroid studies. Labs were collected, results will be available via MyChart or, if abnormal, you will be contacted by clinic staff. You were prescribed medications, please take as directed. Please maintain upcoming follow up in June.

## 2021-07-30 NOTE — Progress Notes (Signed)
[place], [place]  [address] Phone:  [phone]   Fax:  [phone] Subjective:   Patient ID: Tanya Hanson, female    DOB: Dec 19, 1995, 26 y.o.   MRN: [id number]  Chief Complaint  Patient presents with   Office Visit    Pt stated for the past 4 days her throat on the right side hurts when she turns her neck and when she swallow right ear hurts a well. Pt need a refill on levothyroxine   HPI Tanya Hanson 26 y.o. female  has a past medical history of Generalized anxiety disorder, Hirsutism, Irregular menstrual cycle, and PCOS (polycystic ovarian syndrome). To the [facility] for throat pain in the right throat x 4 days.  States that she has been drinking warm tea with no improvement in symptoms. Verbalizes also having right ear pain x 4 days. Currently rates pain 10/10 and describes as throbbing. Took tylenol last night with only mild improvement in symptoms. Patient states that pain increases when she turns her head to the left, or move her head up and down. Denies any fever. Suspects symptoms are related to seasonal allergies.. Requesting allergy medicine. Denies any other complaints today.   Denies any fatigue, chest pain, shortness of breath, HA or dizziness. Denies any blurred vision, numbness or tingling.  Past Medical History:  Diagnosis Date   Generalized anxiety disorder    Hirsutism    Irregular menstrual cycle    PCOS (polycystic ovarian syndrome)     History reviewed. No pertinent surgical history.  Family History  Problem Relation Age of Onset   Gout Father     Social History   Socioeconomic History   Marital status: Single    Spouse name: Not on file   Number of children: Not on file   Years of education: Not on file   Highest education level: Not on file  Occupational History   Not on file  Tobacco Use   Smoking status: Never   Smokeless tobacco: Never  Vaping Use   Vaping Use: Never used  Substance  and Sexual Activity   Alcohol use: No   Drug use: No   Sexual activity: Yes    Birth control/protection: None  Other Topics Concern   Not on file  Social History Narrative   Not on file   Social Determinants of Health   Financial Resource Strain: Not on file  Food Insecurity: Not on file  Transportation Needs: Not on file  Physical Activity: Not on file  Stress: Not on file  Social Connections: Not on file  Intimate Partner Violence: Not on file    Outpatient Medications Prior to Visit  Medication Sig Dispense Refill   levothyroxine (SYNTHROID) 50 MCG tablet Take 1 tablet (50 mcg total) by mouth daily. 50 tablet 0   No facility-administered medications prior to visit.    No Known Allergies  Review of Systems  Constitutional:  Negative for chills, fever and malaise/fatigue.  HENT:  Positive for ear pain and sore throat. Negative for congestion, ear discharge, hearing loss, nosebleeds, sinus pain and tinnitus.        See HPI  Eyes: Negative.   Respiratory:  Negative for cough, hemoptysis, sputum production, shortness of breath, wheezing and stridor.   Cardiovascular:  Negative for chest pain, palpitations and leg swelling.  Gastrointestinal:  Negative for abdominal pain, blood in stool, constipation, diarrhea, nausea and vomiting.  Skin: Negative.   Neurological: Negative.  Psychiatric/Behavioral:  Negative for depression. The patient is not nervous/anxious.   All other systems reviewed and are negative.     Objective:    Physical Exam Vitals reviewed.  Constitutional:      General: She is not in acute distress.    Appearance: Normal appearance. She is obese.  HENT:     Head: Normocephalic.     Right Ear: Tympanic membrane, ear canal and external ear normal. There is no impacted cerumen.     Left Ear: Tympanic membrane, ear canal and external ear normal. There is no impacted cerumen.     Nose: Nose normal. No congestion or rhinorrhea.     Mouth/Throat:      Mouth: Mucous membranes are moist.     Pharynx: Oropharynx is clear. Posterior oropharyngeal erythema present. No oropharyngeal exudate.     Comments: Mild tenderness with palpation of right lateral neck  Eyes:     General: No scleral icterus.       Right eye: No discharge.        Left eye: No discharge.     Extraocular Movements: Extraocular movements intact.     Conjunctiva/sclera: Conjunctivae normal.     Pupils: Pupils are equal, round, and reactive to light.  Cardiovascular:     Rate and Rhythm: Normal rate and regular rhythm.     Pulses: Normal pulses.     Heart sounds: Normal heart sounds.     Comments: No obvious peripheral edema Pulmonary:     Effort: Pulmonary effort is normal.     Breath sounds: Normal breath sounds.  Skin:    General: Skin is warm and dry.     Capillary Refill: Capillary refill takes less than 2 seconds.  Neurological:     General: No focal deficit present.     Mental Status: She is alert and oriented to person, place, and time.  Psychiatric:        Mood and Affect: Mood normal.        Behavior: Behavior normal.        Thought Content: Thought content normal.        Judgment: Judgment normal.    BP 117/87    Pulse 92    Temp 98.4 F (36.9 C)    Ht 5' 5.4" (1.661 m)    Wt (!) 314 lb 0.6 oz (142.4 kg)    SpO2 100%    BMI 51.62 kg/m  Wt Readings from Last 3 Encounters:  07/30/21 (!) 314 lb 0.6 oz (142.4 kg)  05/03/21 (!) 321 lb 0.4 oz (145.6 kg)  04/02/21 (!) 318 lb 0.2 oz (144.2 kg)    Immunization History  Administered Date(s) Administered   Influenza,inj,Quad PF,6+ Mos 08/02/2018, 03/22/2019, 04/02/2021   PFIZER Comirnaty(Gray Top)Covid-19 Tri-Sucrose Vaccine 07/10/2020   PFIZER(Purple Top)SARS-COV-2 Vaccination 07/31/2020, 10/03/2020   Tdap 08/02/2018    Diabetic Foot Exam - Simple   No data filed     Lab Results  Component Value Date   TSH 3.020 07/30/2021   Lab Results  Component Value Date   WBC 7.6 04/02/2021   HGB 13.8  04/02/2021   HCT 41.6 04/02/2021   MCV 93 04/02/2021   PLT 238 04/02/2021   Lab Results  Component Value Date   NA 139 04/02/2021   K 4.3 04/02/2021   CO2 24 04/02/2021   GLUCOSE 89 04/02/2021   BUN 8 04/02/2021   CREATININE 0.92 04/02/2021   BILITOT <0.2 04/02/2021   ALKPHOS 94 04/02/2021   AST  23 04/02/2021   ALT 29 04/02/2021   PROT 6.7 04/02/2021   ALBUMIN 4.5 04/02/2021   CALCIUM 9.4 04/02/2021   ANIONGAP 7 05/28/2017   EGFR 90 04/02/2021   Lab Results  Component Value Date   CHOL 190 04/02/2021   CHOL 164 11/23/2019   CHOL 186 08/02/2018   Lab Results  Component Value Date   HDL 40 04/02/2021   HDL 38 (L) 11/23/2019   HDL 37 (L) 08/02/2018   Lab Results  Component Value Date   LDLCALC 134 (H) 04/02/2021   LDLCALC 111 (H) 11/23/2019   LDLCALC 125 (H) 08/02/2018   Lab Results  Component Value Date   TRIG 88 04/02/2021   TRIG 78 11/23/2019   TRIG 121 08/02/2018   Lab Results  Component Value Date   CHOLHDL 4.8 (H) 04/02/2021   CHOLHDL 4.3 11/23/2019   CHOLHDL 5.0 (H) 08/02/2018   Lab Results  Component Value Date   HGBA1C 5.0 08/02/2018       Assessment & Plan:   Problem List Items Addressed This Visit   None Visit Diagnoses     Hypothyroidism, unspecified type    -  Primary   Relevant Medications   levothyroxine (SYNTHROID) 50 MCG tablet, refill completed without dose change   Other Relevant Orders   TSH+T4F+T3Free (Completed), wnl    Throat pain       Relevant Orders   Rapid Strep A (Completed)   Seasonal allergies       Relevant Medications   cetirizine-pseudoephedrine (ZYRTEC-D) 5-120 MG tablet   Strep throat       Relevant Medications   penicillin v potassium (VEETID) 500 MG tablet   Other specified hypothyroidism       Relevant Medications   levothyroxine (SYNTHROID) 50 MCG tablet  Patient informed to continue taking OTC medications as needed for symptoms Discussed non pharmacological methods for management of symptoms    Maintain upcoming follow up in June, sooner as needed    I am having Tanya Hanson start on cetirizine-pseudoephedrine and penicillin v potassium. I am also having her maintain her levothyroxine.  Meds ordered this encounter  Medications   cetirizine-pseudoephedrine (ZYRTEC-D) 5-120 MG tablet    Sig: Take 1 tablet by mouth 2 (two) times daily.    Dispense:  60 tablet    Refill:  2   penicillin v potassium (VEETID) 500 MG tablet    Sig: Take 1 tablet (500 mg total) by mouth 3 (three) times daily for 10 days.    Dispense:  30 tablet    Refill:  0   levothyroxine (SYNTHROID) 50 MCG tablet    Sig: Take 1 tablet (50 mcg total) by mouth daily.    Dispense:  50 tablet    Refill:  0     Teena Dunk, NP

## 2021-07-31 LAB — TSH+T4F+T3FREE
Free T4: 0.99 ng/dL (ref 0.82–1.77)
T3, Free: 2.7 pg/mL (ref 2.0–4.4)
TSH: 3.02 u[IU]/mL (ref 0.450–4.500)

## 2021-07-31 MED ORDER — LEVOTHYROXINE SODIUM 50 MCG PO TABS: 50.0000 ug | ORAL_TABLET | Freq: Every day | ORAL | 0 refills | Status: DC

## 2021-07-31 NOTE — Progress Notes (Signed)
I called patient left message on voice mail to call for results

## 2021-08-05 ENCOUNTER — Telehealth: Payer: Self-pay | Admitting: Nurse Practitioner

## 2021-08-05 NOTE — Telephone Encounter (Signed)
She came in last week with strep throat, she is now on antibiotics, but her throat is still hurting. She is wondering when her throat is suppose to start feeling better.  ?

## 2021-08-06 NOTE — Telephone Encounter (Addendum)
I called patient to advise her to take OTC medications such as tylenol and/ ibuprofen for symptoms. It takes some time for pain to resolve also notified patient of lab result  ?

## 2021-09-12 NOTE — Progress Notes (Signed)
Appointment cancelled

## 2021-11-01 ENCOUNTER — Ambulatory Visit: Payer: Federal, State, Local not specified - PPO | Admitting: Nurse Practitioner

## 2022-01-03 ENCOUNTER — Ambulatory Visit: Payer: Federal, State, Local not specified - PPO | Admitting: Family Medicine

## 2022-01-03 ENCOUNTER — Encounter: Payer: Self-pay | Admitting: Family Medicine

## 2022-01-03 VITALS — BP 122/80 | HR 72 | Temp 98.4°F | Ht 65.4 in | Wt 321.5 lb

## 2022-01-03 DIAGNOSIS — G44201 Tension-type headache, unspecified, intractable: Secondary | ICD-10-CM | POA: Diagnosis not present

## 2022-01-03 DIAGNOSIS — N926 Irregular menstruation, unspecified: Secondary | ICD-10-CM | POA: Diagnosis not present

## 2022-01-03 DIAGNOSIS — E038 Other specified hypothyroidism: Secondary | ICD-10-CM | POA: Diagnosis not present

## 2022-01-03 LAB — CBC WITH DIFFERENTIAL/PLATELET
Basophils Absolute: 0 10*3/uL (ref 0.0–0.1)
Basophils Relative: 0.4 % (ref 0.0–3.0)
Eosinophils Absolute: 0 10*3/uL (ref 0.0–0.7)
Eosinophils Relative: 0.5 % (ref 0.0–5.0)
HCT: 40.6 % (ref 36.0–46.0)
Hemoglobin: 13.6 g/dL (ref 12.0–15.0)
Lymphocytes Relative: 20.8 % (ref 12.0–46.0)
Lymphs Abs: 1.6 10*3/uL (ref 0.7–4.0)
MCHC: 33.3 g/dL (ref 30.0–36.0)
MCV: 93.9 fl (ref 78.0–100.0)
Monocytes Absolute: 0.6 10*3/uL (ref 0.1–1.0)
Monocytes Relative: 7.7 % (ref 3.0–12.0)
Neutro Abs: 5.4 10*3/uL (ref 1.4–7.7)
Neutrophils Relative %: 70.6 % (ref 43.0–77.0)
Platelets: 234 10*3/uL (ref 150.0–400.0)
RBC: 4.33 Mil/uL (ref 3.87–5.11)
RDW: 13.8 % (ref 11.5–15.5)
WBC: 7.7 10*3/uL (ref 4.0–10.5)

## 2022-01-03 LAB — COMPREHENSIVE METABOLIC PANEL
ALT: 32 U/L (ref 0–35)
AST: 22 U/L (ref 0–37)
Albumin: 4.2 g/dL (ref 3.5–5.2)
Alkaline Phosphatase: 80 U/L (ref 39–117)
BUN: 9 mg/dL (ref 6–23)
CO2: 27 mEq/L (ref 19–32)
Calcium: 9.2 mg/dL (ref 8.4–10.5)
Chloride: 104 mEq/L (ref 96–112)
Creatinine, Ser: 0.86 mg/dL (ref 0.40–1.20)
GFR: 94.42 mL/min (ref >60.00)
Glucose, Bld: 93 mg/dL (ref 70–99)
Potassium: 4.3 mEq/L (ref 3.5–5.1)
Sodium: 138 mEq/L (ref 135–145)
Total Bilirubin: 0.4 mg/dL (ref 0.2–1.2)
Total Protein: 7 g/dL (ref 6.0–8.3)

## 2022-01-03 LAB — LIPID PANEL
Cholesterol: 179 mg/dL (ref 0–200)
HDL: 36.8 mg/dL — ABNORMAL LOW (ref >39.00)
LDL Cholesterol: 117 mg/dL — ABNORMAL HIGH (ref 0–99)
NonHDL: 142.44
Total CHOL/HDL Ratio: 5
Triglycerides: 125 mg/dL (ref 0.0–149.0)
VLDL: 25 mg/dL (ref 0.0–40.0)

## 2022-01-03 LAB — SEDIMENTATION RATE: Sed Rate: 17 mm/hr (ref 0–20)

## 2022-01-03 LAB — TSH: TSH: 4.23 u[IU]/mL (ref 0.35–5.50)

## 2022-01-03 LAB — HEMOGLOBIN A1C: Hgb A1c MFr Bld: 5.4 % (ref 4.6–6.5)

## 2022-01-03 LAB — C-REACTIVE PROTEIN: CRP: <1 mg/dL (ref 0.5–20.0)

## 2022-01-03 LAB — T3, FREE: T3, Free: 3.8 pg/mL (ref 2.3–4.2)

## 2022-01-03 LAB — T4, FREE: Free T4: 0.69 ng/dL (ref 0.60–1.60)

## 2022-01-03 MED ORDER — SUMATRIPTAN SUCCINATE 50 MG PO TABS: 50.0000 mg | ORAL_TABLET | ORAL | 3 refills | Status: DC | PRN

## 2022-01-03 MED ORDER — PREDNISONE 20 MG PO TABS: 40.0000 mg | ORAL_TABLET | Freq: Every day | ORAL | 0 refills | Status: AC

## 2022-01-03 NOTE — Progress Notes (Signed)
New Patient Office Visit  Subjective:  Patient ID: Tanya Hanson, female    DOB: Oct 05, 1995  Age: 26 y.o. MRN: [id number]  CC:  Chief Complaint  Patient presents with   Establish Care    Need new pcp, last doctor left practice Fasting for labs Having had a cycle in 3 years, irregular menstrual cycle   Headache    Right-sided headache causing ear pain and scalp sore that started 2 weeks ago, took Excedrin and tylenol with no releif    HPI Tanya Hanson presents for new pt-doc left so no f/u.  Hypothyroidism-dx early this year-placed on meds but no f/u and out for 3 months. Felt better on meds.  Wt was better on meds but elevated again.  Irreg menses-only 3-4 cycles/yr since 26yo.  Was on ocp and bled for 2 months. LMP 3 yrs ago. Only SA w/female HA-h/o migraine since 26yo-before ha, black spots for few sec and then photo and HA.  On R and ear aches, some phono.  No n/v.  Usu last 3-4 days. Excedrin and tylenol will ease it and advil.  Throbbing.  Can be bad.  Had to go to ER at times and get "cocktail" and sleeps for 3 days. No rx meds.  Was 1-2/mo.  Now, 2 wks on R side.  Scalp tender and sens light on R.  Meds ease it, but can't get rid of it.  More stress.  No sores, etc.       No f/c/URI.   Past Medical History:  Diagnosis Date   Allergy    Generalized anxiety disorder    Hirsutism    Irregular menstrual cycle    PCOS (polycystic ovarian syndrome)     History reviewed. No pertinent surgical history.  Family History  Problem Relation Age of Onset   Gout Father    Cancer Paternal Grandmother 13       breast   Heart disease Paternal Grandmother    COPD Paternal Grandfather     Social History   Socioeconomic History   Marital status: Single    Spouse name: Not on file   Number of children: 0   Years of education: Not on file   Highest education level: Not on file  Occupational History   Not on file  Tobacco Use   Smoking status: Never   Smokeless  tobacco: Never  Vaping Use   Vaping Use: Never used  Substance and Sexual Activity   Alcohol use: No   Drug use: No   Sexual activity: Yes    Birth control/protection: None  Other Topics Concern   Not on file  Social History Narrative   Girlfriend      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   Social Determinants of Health   Financial Resource Strain: Not on file  Food Insecurity: Not on file  Transportation Needs: Not on file  Physical Activity: Not on file  Stress: Not on file  Social Connections: Not on file  Intimate Partner Violence: Not on file    ROS  ROS: Gen: no fever, chills  Skin: no rash, itching ENT: no ear drainage, nasal congestion, rhinorrhea, sinus pressure, sore throat Eyes: no blurry vision, double vision Resp: no cough, wheeze,SOB CV: no CP, palpitations, LE edema,  GI: no heartburn, n/v/d/c, abd pain GU: no dysuria, urgency, frequency, hematuria MSK: no joint pain, myalgias, back pain Neuro: no dizziness,, weakness, vertigo    Objective:   Today's Vitals: BP 122/80  Pulse 72   Temp 98.4 F (36.9 C) (Temporal)   Ht 5' 5.4" (1.661 m)   Wt (!) 321 lb 8 oz (145.8 kg)   SpO2 99%   BMI 52.85 kg/m   Physical Exam  Gen: WDWN NAD MOAAF HEENT: NCAT, conjunctiva not injected, sclera nonicteric TM WNL B, OP moist, no exudates  NECK:  supple, no thyromegaly, no nodes, no carotid bruits CARDIAC: RRR, S1S2+, no murmur. DP 2+B LUNGS: CTAB. No wheezes ABDOMEN:  BS+, soft, NTND, No HSM, no masses EXT:  no edema MSK: no gross abnormalities.  NEURO: A&O x3.  CN II-XII intact.  PSYCH: normal mood. Good eye contact  Scalp-no lesions.  Some TTP parietal area on R only.  Has acanthosis nigrans on neck  Assessment & Plan:   Problem List Items Addressed This Visit       Other   Irregular periods/menstrual cycles   Relevant Orders   Comprehensive metabolic panel   Hemoglobin A1c   Lipid panel   TSH   Intractable tension-type headache -  Primary   Relevant Medications   SUMAtriptan (IMITREX) 50 MG tablet   Other Relevant Orders   Comprehensive metabolic panel   CBC with Differential/Platelet   C-reactive protein   Sedimentation rate   Other Visit Diagnoses     Other specified hypothyroidism       Relevant Orders   TSH   T3, free   T4, free     1.  Headache-currently intractable for 2 weeks.  I do believe she has migraine headaches.  We discussed this.  We will try magnesium daily.  We will do trial of sumatriptan 50 mg at next headache.(SED).  Keep log of headaches.  Currently she has a 2-week intractable headache that is different than her usual only if that she has some scalp tenderness.  Exam was negative except for tenderness of the scalp.  Will do prednisone 40 mg daily for 5 days.  Check CBC, CMP, sed rate, C-reactive protein.  Follow-up 1 month or sooner if continues 2.  Hypothyroidism-patient was on Synthroid, but had run out and it was not refilled.  Will check TSH, free T3, free T4. 3.  Irregular menses-since at least 26 years old.  Now amenorrhea for 3 years.  She is sexually active with women only.  So pregnancy is not a concern.  Will check CBC, CMP, TSH, A1c.  Refer gyn. Outpatient Encounter Medications as of 01/03/2022  Medication Sig   predniSONE (DELTASONE) 20 MG tablet Take 2 tablets (40 mg total) by mouth daily with breakfast for 5 days.   SUMAtriptan (IMITREX) 50 MG tablet Take 1 tablet (50 mg total) by mouth every 2 (two) hours as needed for migraine. May repeat in 2 hours if headache persists or recurs.   levothyroxine (SYNTHROID) 50 MCG tablet Take 1 tablet (50 mcg total) by mouth daily. (Patient not taking: Reported on 01/03/2022)   No facility-administered encounter medications on file as of 01/03/2022.    Follow-up: Return in about 4 weeks (around 01/31/2022) for migraine.   Angelena Sole, MD

## 2022-01-03 NOTE — Patient Instructions (Addendum)
Welcome to [facility]! It was a pleasure meeting you today.  As discussed, Please schedule a 1 month follow up visit today.  Take magnesium 250mg  daily.  Log the headaches.   Sumatriptan at onset of headache.   Refer gyn  PLEASE NOTE:  If you had any LAB tests please let know if you have not heard back within a few days. You may see your results on MyChart before we have a chance to review them but we will give you a call once they are reviewed by [place]. If we ordered any REFERRALS today, please let us know if you have not heard from their office within the next week.  Let us know through MyChart if you are needing REFILLS, or have your pharmacy send [place] the request. You can also use MyChart to communicate with me or any office staff.  Please try these tips to maintain a healthy lifestyle:  Eat most of your calories during the day when you are active. Eliminate processed foods including packaged sweets (pies, cakes, cookies), reduce intake of potatoes, white bread, white pasta, and white rice. Look for whole grain options, oat flour or almond flour.  Each meal should contain half fruits/vegetables, one quarter protein, and one quarter carbs (no bigger than a computer mouse).  Cut down on sweet beverages. This includes juice, soda, and sweet tea. Also watch fruit intake, though this is a healthier sweet option, it still contains natural sugar! Limit to 3 servings daily.  Drink at least 1 glass of water with each meal and aim for at least 8 glasses per day  Exercise at least 150 minutes every week.

## 2022-01-06 ENCOUNTER — Other Ambulatory Visit: Payer: Self-pay | Admitting: *Deleted

## 2022-01-06 DIAGNOSIS — E038 Other specified hypothyroidism: Secondary | ICD-10-CM

## 2022-01-06 MED ORDER — LEVOTHYROXINE SODIUM 50 MCG PO TABS: 50.0000 ug | ORAL_TABLET | Freq: Every day | ORAL | 1 refills | Status: DC

## 2022-01-31 ENCOUNTER — Ambulatory Visit: Payer: Federal, State, Local not specified - PPO | Admitting: Family Medicine

## 2022-02-14 ENCOUNTER — Encounter: Payer: Self-pay | Admitting: Family Medicine

## 2022-02-14 ENCOUNTER — Ambulatory Visit: Payer: Federal, State, Local not specified - PPO | Admitting: Family Medicine

## 2022-02-14 VITALS — BP 127/84 | HR 73 | Temp 98.8°F | Ht 65.4 in | Wt 317.6 lb

## 2022-02-14 DIAGNOSIS — L509 Urticaria, unspecified: Secondary | ICD-10-CM | POA: Diagnosis not present

## 2022-02-14 DIAGNOSIS — G43009 Migraine without aura, not intractable, without status migrainosus: Secondary | ICD-10-CM | POA: Diagnosis not present

## 2022-02-14 DIAGNOSIS — L299 Pruritus, unspecified: Secondary | ICD-10-CM | POA: Diagnosis not present

## 2022-02-14 MED ORDER — EPINEPHRINE 0.3 MG/0.3ML IJ SOAJ: 0.3000 mg | INTRAMUSCULAR | 1 refills | Status: DC | PRN

## 2022-02-14 NOTE — Patient Instructions (Addendum)
Keep log of allergies.  Food, drink, condiments, etc.    Referral done to allergist Call the gynecologist

## 2022-02-14 NOTE — Progress Notes (Signed)
Subjective:     Patient ID: Tanya Hanson, female    DOB: 10-03-1995, 26 y.o.   MRN: [id number]  Chief Complaint  Patient presents with   Migraine    Pt states that she has had a couple of headaches since being in the office last. She has monitored triggers and believes she has migraines when she doesn't eat and when she drinks wine. 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    Allergic Reaction    She would also like to be tested for allergies. She states that she had random itching and breakouts.    HPI  Migraine-has had a couple since last seen-triggers-not eating and wine. Had red wine last weekend and got bad HA quickly after.   Imitrex helped, but CP w/it.  Allergic rxn's-Long hx. random itching and hives.  Will take some benadryl and helps.  Olene Floss has a lot of allergies.  Family wants her to see allergist.   Tide will make her itch.  Pineapples-bad.   There are no preventive care reminders to display for this patient.  Past Medical History:  Diagnosis Date   Allergy    Generalized anxiety disorder    Hirsutism    Irregular menstrual cycle    PCOS (polycystic ovarian syndrome)     History reviewed. No pertinent surgical history.  Outpatient Medications Prior to Visit  Medication Sig Dispense Refill   levothyroxine (SYNTHROID) 50 MCG tablet Take 1 tablet (50 mcg total) by mouth daily. 90 tablet 1   SUMAtriptan (IMITREX) 50 MG tablet Take 1 tablet (50 mg total) by mouth every 2 (two) hours as needed for migraine. May repeat in 2 hours if headache persists or recurs. 10 tablet 3   No facility-administered medications prior to visit.    Allergies  Allergen Reactions   Pineapple Itching and Swelling   ROS neg/noncontributory except as noted HPI/below Has been walking more.  Taking synthroid regularly-on for 1 mo.   Forgot to call gyn-will      Objective:     BP 127/84 (BP Location: Left Arm, Patient Position: Sitting, Cuff Size: Large)   Pulse 73    Temp 98.8 F (37.1 C) (Temporal)   Ht 5' 5.4" (1.661 m)   Wt (!) 317 lb 9.6 oz (144.1 kg)   SpO2 100%   BMI 52.21 kg/m  Wt Readings from Last 3 Encounters:  02/14/22 (!) 317 lb 9.6 oz (144.1 kg)  01/03/22 (!) 321 lb 8 oz (145.8 kg)  07/30/21 (!) 314 lb 0.6 oz (142.4 kg)    Physical Exam   Gen: WDWN NAD HEENT: NCAT, conjunctiva not injected, sclera nonicteric EXT:  no edema MSK: no gross abnormalities.  NEURO: A&O x3.  CN II-XII intact.  PSYCH: normal mood. Good eye contact     Assessment & Plan:   Problem List Items Addressed This Visit   None Visit Diagnoses     Migraine without aura and without status migrainosus, not intractable    -  Primary   Relevant Medications   EPINEPHrine 0.3 mg/0.3 mL IJ SOAJ injection   Hives       Relevant Orders   Ambulatory referral to Allergy   Itching       Relevant Orders   Ambulatory referral to Allergy      Migraine-chronic.  Stable.  Pt keeping log of events/triggers.  Doesn't want different meds at this time.  F/u 6 mo Hives/itching.- long time.  Keep log of foods,  etc.  Epi pen d/t allergy pineapples.  Refer allergist.    Meds ordered this encounter  Medications   EPINEPHrine 0.3 mg/0.3 mL IJ SOAJ injection    Sig: Inject 0.3 mg into the muscle as needed for anaphylaxis.    Dispense:  1 each    Refill:  1    Angelena Sole, MD

## 2022-04-08 ENCOUNTER — Encounter: Payer: Self-pay | Admitting: Family Medicine

## 2022-04-08 ENCOUNTER — Ambulatory Visit: Payer: Federal, State, Local not specified - PPO | Admitting: Family Medicine

## 2022-04-08 VITALS — BP 122/78 | HR 71 | Temp 98.4°F | Ht 65.4 in | Wt 317.5 lb

## 2022-04-08 DIAGNOSIS — L600 Ingrowing nail: Secondary | ICD-10-CM | POA: Diagnosis not present

## 2022-04-08 MED ORDER — CEPHALEXIN 500 MG PO CAPS: 500.0000 mg | ORAL_CAPSULE | Freq: Two times a day (BID) | ORAL | 0 refills | Status: AC

## 2022-04-08 NOTE — Progress Notes (Signed)
   Subjective:     Patient ID: Tanya Hanson, female    DOB: Jul 03, 1995, 26 y.o.   MRN: [id number]  Chief Complaint  Patient presents with   Follow-up    Follow-up on left big toe pain, ingrown toenail, soreness, possible infection, happened 2 months ago, has pus coming out      HPI-here w/GF L great toe pain d/t ingrown nail. Going on for 2 mo.  Pus now.  Had toes done in Sept 21  long h/o ingrown toenails  so goes to nail shop   still sore nail.  Pus few wks ago.  Soaking, etc.  Nails removed in middle school and HS in Peds office.  Did see pod in past   Health Maintenance Due  Topic Date Due   Hepatitis C Screening  Never done    Past Medical History:  Diagnosis Date   Allergy    Generalized anxiety disorder    Hirsutism    Irregular menstrual cycle    PCOS (polycystic ovarian syndrome)     History reviewed. No pertinent surgical history.  Outpatient Medications Prior to Visit  Medication Sig Dispense Refill   EPINEPHrine 0.3 mg/0.3 mL IJ SOAJ injection Inject 0.3 mg into the muscle as needed for anaphylaxis. 1 each 1   levothyroxine (SYNTHROID) 50 MCG tablet Take 1 tablet (50 mcg total) by mouth daily. 90 tablet 1   SUMAtriptan (IMITREX) 50 MG tablet Take 1 tablet (50 mg total) by mouth every 2 (two) hours as needed for migraine. May repeat in 2 hours if headache persists or recurs. 10 tablet 3   No facility-administered medications prior to visit.    Allergies  Allergen Reactions   Pineapple Itching and Swelling   ROS neg/noncontributory except as noted HPI/below      Objective:     BP 122/78   Pulse 71   Temp 98.4 F (36.9 C) (Temporal)   Ht 5' 5.4" (1.661 m)   Wt (!) 317 lb 8 oz (144 kg)   SpO2 99%   BMI 52.19 kg/m  Wt Readings from Last 3 Encounters:  04/08/22 (!) 317 lb 8 oz (144 kg)  02/14/22 (!) 317 lb 9.6 oz (144.1 kg)  01/03/22 (!) 321 lb 8 oz (145.8 kg)    Physical Exam   Gen: WDWN NAD HEENT: NCAT, conjunctiva not injected,  sclera nonicteric EXT:  no edema MSK: no gross abnormalities.  NEURO: A&O x3.  CN II-XII intact.  PSYCH: normal mood. Good eye contact L great toenail-red, swollen,tender both sides and dried pus.      Assessment & Plan:   Problem List Items Addressed This Visit   None Visit Diagnoses     Ingrown toenail of left foot with infection    -  Primary   Relevant Medications   cephALEXin (KEFLEX) 500 MG capsule   Other Relevant Orders   Ambulatory referral to Podiatry      Ingrown L great toenail w/infection.  Acute on chronic.  Keflex 500bid.  Soaks.  Refer to pod.    Meds ordered this encounter  Medications   cephALEXin (KEFLEX) 500 MG capsule    Sig: Take 1 capsule (500 mg total) by mouth 2 (two) times daily for 7 days. Take for 7 days    Dispense:  14 capsule    Refill:  0    Wellington Hampshire, MD

## 2022-04-08 NOTE — Patient Instructions (Signed)
It was very nice to see you today!  Soaks, antibiotics, referral done for podiatry.    PLEASE NOTE:  If you had any lab tests please let us know if you have not heard back within a few days. You may see your results on MyChart before we have a chance to review them but we will give you a call once they are reviewed by [place]. If we ordered any referrals today, please let us know if you have not heard from their office within the next week.   Please try these tips to maintain a healthy lifestyle:  Eat most of your calories during the day when you are active. Eliminate processed foods including packaged sweets (pies, cakes, cookies), reduce intake of potatoes, white bread, white pasta, and white rice. Look for whole grain options, oat flour or almond flour.  Each meal should contain half fruits/vegetables, one quarter protein, and one quarter carbs (no bigger than a computer mouse).  Cut down on sweet beverages. This includes juice, soda, and sweet tea. Also watch fruit intake, though this is a healthier sweet option, it still contains natural sugar! Limit to 3 servings daily.  Drink at least 1 glass of water with each meal and aim for at least 8 glasses per day  Exercise at least 150 minutes every week.

## 2022-05-01 ENCOUNTER — Ambulatory Visit: Payer: Federal, State, Local not specified - PPO | Admitting: Podiatry

## 2022-05-06 ENCOUNTER — Ambulatory Visit: Payer: Federal, State, Local not specified - PPO | Admitting: Podiatry

## 2022-05-15 ENCOUNTER — Telehealth: Payer: Self-pay | Admitting: *Deleted

## 2022-05-15 ENCOUNTER — Encounter: Payer: Self-pay | Admitting: Podiatry

## 2022-05-15 ENCOUNTER — Ambulatory Visit: Payer: Federal, State, Local not specified - PPO | Admitting: Podiatry

## 2022-05-15 DIAGNOSIS — L600 Ingrowing nail: Secondary | ICD-10-CM

## 2022-05-15 MED ORDER — NEOMYCIN-POLYMYXIN-HC 1 % OT SOLN: OTIC | 1 refills | Status: DC

## 2022-05-15 NOTE — Telephone Encounter (Signed)
Patient calling and said that her current pharmacy is out of the neomycin-polymyxin-resent to the [facility] at [address], have in stock,patient has been updated.

## 2022-05-15 NOTE — Patient Instructions (Signed)

## 2022-05-18 NOTE — Progress Notes (Signed)
  Subjective:  Patient ID: Tanya Hanson, female    DOB: 1995-06-24,  MRN: [id number] HPI Chief Complaint  Patient presents with   Toe Pain    Hallux left - both borders, ingrowns intermittent throughout the years, had nail removed and partially removed multiple times, salon cut out the last time few weeks ago and now seems to be infected, very sore   New Patient (Initial Visit)    Est pt 2018    26 y.o. female presents with the above complaint.   ROS: Denies fever chills nausea vomit muscle aches pains calf pain back pain chest pain shortness of breath.  Past Medical History:  Diagnosis Date   Allergy    Generalized anxiety disorder    Hirsutism    Irregular menstrual cycle    PCOS (polycystic ovarian syndrome)    No past surgical history on file.  Current Outpatient Medications:    EPINEPHrine 0.3 mg/0.3 mL IJ SOAJ injection, Inject 0.3 mg into the muscle as needed for anaphylaxis., Disp: 1 each, Rfl: 1   levothyroxine (SYNTHROID) 50 MCG tablet, Take 1 tablet (50 mcg total) by mouth daily., Disp: 90 tablet, Rfl: 1   NEOMYCIN-POLYMYXIN-HYDROCORTISONE (CORTISPORIN) 1 % SOLN OTIC solution, Apply 1-2 drops to toe BID after soaking, Disp: 10 mL, Rfl: 1   SUMAtriptan (IMITREX) 50 MG tablet, Take 1 tablet (50 mg total) by mouth every 2 (two) hours as needed for migraine. May repeat in 2 hours if headache persists or recurs., Disp: 10 tablet, Rfl: 3  Allergies  Allergen Reactions   Pineapple Itching and Swelling   Review of Systems Objective:  There were no vitals filed for this visit.  General: Well developed, nourished, in no acute distress, alert and oriented x3   Dermatological: Skin is warm, dry and supple bilateral. Nails x 10 are well maintained; remaining integument appears unremarkable at this time. There are no open sores, no preulcerative lesions, no rash or signs of infection present.  Vascular: Dorsalis Pedis artery and Posterior Tibial artery pedal pulses are  2/4 bilateral with immedate capillary fill time. Pedal hair growth present. No varicosities and no lower extremity edema present bilateral.   Neruologic: Grossly intact via light touch bilateral. Vibratory intact via tuning fork bilateral. Protective threshold with Semmes Wienstein monofilament intact to all pedal sites bilateral. Patellar and Achilles deep tendon reflexes 2+ bilateral. No Babinski or clonus noted bilateral.   Musculoskeletal: No gross boney pedal deformities bilateral. No pain, crepitus, or limitation noted with foot and ankle range of motion bilateral. Muscular strength 5/5 in all groups tested bilateral.  Gait: Unassisted, Nonantalgic.    Radiographs:  None taken  Assessment & Plan:   Assessment: Ingrown toenail painful tibial-fibular border hallux left  Plan: Ingrown toenail was removed today with chemical matricectomy to the tibial-fibular border.  Tolerated procedure well after local anesthetic was administered was given both oral and written home-going instructions for the care and soaking of the toe as well as a prescription for Cortisporin Otic to be applied twice daily after soaking.  Like to follow-up with her in 2 to 3 weeks to make sure she is healing well.     Tanya Hanson, [place]

## 2022-05-29 ENCOUNTER — Ambulatory Visit: Payer: Federal, State, Local not specified - PPO | Admitting: Podiatry

## 2023-01-28 ENCOUNTER — Encounter: Payer: Federal, State, Local not specified - PPO | Admitting: Family Medicine

## 2023-03-06 ENCOUNTER — Ambulatory Visit (INDEPENDENT_AMBULATORY_CARE_PROVIDER_SITE_OTHER): Payer: Federal, State, Local not specified - PPO | Admitting: Family Medicine

## 2023-03-06 ENCOUNTER — Encounter: Payer: Self-pay | Admitting: Family Medicine

## 2023-03-06 VITALS — BP 118/78 | HR 81 | Temp 98.1°F | Resp 18 | Ht 65.4 in | Wt 311.4 lb

## 2023-03-06 DIAGNOSIS — Z23 Encounter for immunization: Secondary | ICD-10-CM | POA: Diagnosis not present

## 2023-03-06 DIAGNOSIS — Z131 Encounter for screening for diabetes mellitus: Secondary | ICD-10-CM | POA: Diagnosis not present

## 2023-03-06 DIAGNOSIS — G43009 Migraine without aura, not intractable, without status migrainosus: Secondary | ICD-10-CM | POA: Diagnosis not present

## 2023-03-06 DIAGNOSIS — Z1159 Encounter for screening for other viral diseases: Secondary | ICD-10-CM | POA: Diagnosis not present

## 2023-03-06 DIAGNOSIS — Z0000 Encounter for general adult medical examination without abnormal findings: Secondary | ICD-10-CM

## 2023-03-06 DIAGNOSIS — E038 Other specified hypothyroidism: Secondary | ICD-10-CM

## 2023-03-06 DIAGNOSIS — Z13220 Encounter for screening for lipoid disorders: Secondary | ICD-10-CM

## 2023-03-06 LAB — CBC WITH DIFFERENTIAL/PLATELET
Basophils Absolute: 0 10*3/uL (ref 0.0–0.1)
Basophils Relative: 0.3 % (ref 0.0–3.0)
Eosinophils Absolute: 0.1 10*3/uL (ref 0.0–0.7)
Eosinophils Relative: 1 % (ref 0.0–5.0)
HCT: 41.5 % (ref 36.0–46.0)
Hemoglobin: 13.5 g/dL (ref 12.0–15.0)
Lymphocytes Relative: 28 % (ref 12.0–46.0)
Lymphs Abs: 2 10*3/uL (ref 0.7–4.0)
MCHC: 32.5 g/dL (ref 30.0–36.0)
MCV: 93.6 fL (ref 78.0–100.0)
Monocytes Absolute: 0.5 10*3/uL (ref 0.1–1.0)
Monocytes Relative: 6.5 % (ref 3.0–12.0)
Neutro Abs: 4.7 10*3/uL (ref 1.4–7.7)
Neutrophils Relative %: 64.2 % (ref 43.0–77.0)
Platelets: 247 10*3/uL (ref 150.0–400.0)
RBC: 4.43 Mil/uL (ref 3.87–5.11)
RDW: 13.3 % (ref 11.5–15.5)
WBC: 7.3 10*3/uL (ref 4.0–10.5)

## 2023-03-06 LAB — COMPREHENSIVE METABOLIC PANEL
ALT: 25 U/L (ref 0–35)
AST: 18 U/L (ref 0–37)
Albumin: 4.3 g/dL (ref 3.5–5.2)
Alkaline Phosphatase: 69 U/L (ref 39–117)
BUN: 10 mg/dL (ref 6–23)
CO2: 26 meq/L (ref 19–32)
Calcium: 9.5 mg/dL (ref 8.4–10.5)
Chloride: 104 meq/L (ref 96–112)
Creatinine, Ser: 0.86 mg/dL (ref 0.40–1.20)
GFR: 93.65 mL/min (ref >60.00)
Glucose, Bld: 93 mg/dL (ref 70–99)
Potassium: 4 meq/L (ref 3.5–5.1)
Sodium: 139 meq/L (ref 135–145)
Total Bilirubin: 0.4 mg/dL (ref 0.2–1.2)
Total Protein: 7.2 g/dL (ref 6.0–8.3)

## 2023-03-06 LAB — TSH: TSH: 4.04 u[IU]/mL (ref 0.35–5.50)

## 2023-03-06 LAB — LIPID PANEL
Cholesterol: 190 mg/dL (ref 0–200)
HDL: 44.7 mg/dL (ref >39.00)
LDL Cholesterol: 126 mg/dL — ABNORMAL HIGH (ref 0–99)
NonHDL: 145.73
Total CHOL/HDL Ratio: 4
Triglycerides: 99 mg/dL (ref 0.0–149.0)
VLDL: 19.8 mg/dL (ref 0.0–40.0)

## 2023-03-06 LAB — HEMOGLOBIN A1C: Hgb A1c MFr Bld: 5.1 % (ref 4.6–6.5)

## 2023-03-06 MED ORDER — NURTEC 75 MG PO TBDP: 75.0000 mg | ORAL_TABLET | Freq: Every day | ORAL | 1 refills | Status: DC | PRN

## 2023-03-06 NOTE — Progress Notes (Unsigned)
Phone [phone]   Subjective:   Patient is a 27 y.o. female presenting for annual physical.    Chief Complaint  Patient presents with  . Annual Exam    CPE Fasting    annual - exercises, pescitarian.  Not losing wt much.   hypothyroidism - on synthroid 50  migraine - ***   See problem oriented charting- ROS- ROS: Gen: no fever, chills  Skin: no rash, itching ENT: no ear pain, ear drainage, nasal congestion, rhinorrhea, sinus pressure, sore throat Eyes: no blurry vision, double vision Resp: no cough, wheeze,SOB CV: no CP, palpitations, LE edema,  GI: no heartburn, n/v/d/c, abd pain GU: no dysuria, urgency, frequency, hematuria MSK: no joint pain, myalgias, back pain Neuro: no dizziness, headache, weakness, vertigo Psych: no depression, anxiety, insomnia, SI   The following were reviewed and entered/updated in epic: Past Medical History:  Diagnosis Date  . Allergy   . Generalized anxiety disorder   . Hirsutism   . Irregular menstrual cycle   . PCOS (polycystic ovarian syndrome)    Patient Active Problem List   Diagnosis Date Noted  . Intractable tension-type headache 03/23/2019  . Hirsutism 08/02/2018  . PCOS (polycystic ovarian syndrome) 08/02/2018  . Irregular periods/menstrual cycles 08/02/2018  . Class 3 severe obesity due to excess calories without serious comorbidity with body mass index (BMI) of 40.0 to 44.9 in adult [facility]) 08/02/2018  . Generalized anxiety disorder 08/02/2018   History reviewed. No pertinent surgical history.  Family History  Problem Relation Age of Onset  . Gout Father   . Cancer Paternal Grandmother 69       breast  . Heart disease Paternal Grandmother   . COPD Paternal Grandfather     Medications- reviewed and updated Current Outpatient Medications  Medication Sig Dispense Refill  . EPINEPHrine 0.3 mg/0.3 mL IJ SOAJ injection Inject 0.3 mg into the muscle as needed for anaphylaxis. 1 each 1  . levothyroxine (SYNTHROID)  50 MCG tablet Take 1 tablet (50 mcg total) by mouth daily. 90 tablet 1  . SUMAtriptan (IMITREX) 50 MG tablet Take 1 tablet (50 mg total) by mouth every 2 (two) hours as needed for migraine. May repeat in 2 hours if headache persists or recurs. 10 tablet 3   No current facility-administered medications for this visit.    Allergies-reviewed and updated Allergies  Allergen Reactions  . 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 DP 2+B LUNGS: CTAB. No wheezes ABDOMEN:  BS+, soft, NTND, No HSM, no masses EXT:  no edema MSK: no gross abnormalities. MS 5/5 all 4 NEURO: A&O x3.  CN II-XII intact.  PSYCH: normal mood. Good eye contact      Assessment and Plan   Health Maintenance counseling: 1. Anticipatory guidance: Patient counseled regarding regular dental exams q6 months, eye exams,  avoiding smoking and second hand smoke, limiting alcohol to 1  beverage per day, no illicit drugs.   2. Risk factor reduction:  Advised patient of need for regular exercise and diet rich and fruits and vegetables to reduce risk of heart attack and stroke. Exercise- ***.  Wt Readings from Last 3 Encounters:  03/06/23 (!) 311 lb 6 oz (141.2 kg)  04/08/22 (!) 317 lb 8 oz (144 kg)  02/14/22 (!) 317 lb 9.6 oz (144.1 kg)   3. Immunizations/screenings/ancillary studies Immunization History  Administered Date(s) Administered  . DTaP 09/06/1997, 11/06/1997, 03/15/1998, 01/09/1999  . Dtap, Unspecified 01/05/2003  .  Fluzone Influenza virus vaccine,trivalent (IIV3), split virus 02/25/2010, 03/10/2011  . HIB (PRP-OMP) 09/06/1997, 11/06/1997, 08/20/1998  . HPV Quadrivalent 12/25/2008, 02/26/2009, 07/04/2009  . Hep B, Unspecified 04/18/1997, 06/21/1997, 03/15/1998  . Hepatitis A, Ped/Adol-2 Dose 07/11/2014, 02/19/2015  . IPV 09/06/1997, 11/06/1997, 01/09/1999  . Influenza,inj,Quad PF,6+ Mos 08/02/2018, 03/22/2019, 04/02/2021  . MMR 08/20/1998, 01/05/2003  . Meningococcal Conjugate 02/26/2009, 07/11/2014  . PFIZER Comirnaty(Gray Top)Covid-19 Tri-Sucrose Vaccine 07/10/2020  . PFIZER(Purple Top)SARS-COV-2 Vaccination 09/10/2019, 10/04/2019, 07/31/2020, 10/03/2020  . PPD Test 11/27/2022  . Polio, Unspecified 01/05/2003  . Tdap 12/25/2008, 08/02/2018   Health Maintenance Due  Topic Date Due  . Hepatitis C Screening  Never done  . INFLUENZA VACCINE  01/01/2023    4. Cervical cancer screening: *** 5. Skin cancer screening- advised regular sunscreen use. Denies worrisome, changing, or new skin lesions.  6. Birth control/STD check: *** 7. Smoking associated screening: *** smoker 8. Alcohol screening: ***  There are no diagnoses linked to this encounter.  Recommended follow up: No follow-ups on file.  Lab/Order associations:*** fasting   I,Rachel Rivera,acting as a scribe for Angelena Sole, MD.,have documented all relevant documentation on the behalf of Angelena Sole, MD,as directed by  Angelena Sole, MD while in the presence of Angelena Sole, MD.  I, Angelena Sole, MD, have reviewed all documentation for this visit. The documentation on 03/06/23 for the exam, diagnosis, procedures, and orders are all accurate and complete.  ***   Angelena Sole, MD

## 2023-03-06 NOTE — Patient Instructions (Signed)
It was very nice to see you today!  Wegovy or zepbound.    PLEASE NOTE:  If you had any lab tests please let us know if you have not heard back within a few days. You may see your results on MyChart before we have a chance to review them but we will give you a call once they are reviewed by [place]. If we ordered any referrals today, please let us know if you have not heard from their office within the next week.   Please try these tips to maintain a healthy lifestyle:  Eat most of your calories during the day when you are active. Eliminate processed foods including packaged sweets (pies, cakes, cookies), reduce intake of potatoes, white bread, white pasta, and white rice. Look for whole grain options, oat flour or almond flour.  Each meal should contain half fruits/vegetables, one quarter protein, and one quarter carbs (no bigger than a computer mouse).  Cut down on sweet beverages. This includes juice, soda, and sweet tea. Also watch fruit intake, though this is a healthier sweet option, it still contains natural sugar! Limit to 3 servings daily.  Drink at least 1 glass of water with each meal and aim for at least 8 glasses per day  Exercise at least 150 minutes every week.

## 2023-03-07 ENCOUNTER — Encounter: Payer: Self-pay | Admitting: Family Medicine

## 2023-03-07 DIAGNOSIS — G43009 Migraine without aura, not intractable, without status migrainosus: Secondary | ICD-10-CM | POA: Insufficient documentation

## 2023-03-07 DIAGNOSIS — E038 Other specified hypothyroidism: Secondary | ICD-10-CM | POA: Insufficient documentation

## 2023-03-07 LAB — HEPATITIS C ANTIBODY: Hepatitis C Ab: NONREACTIVE

## 2023-03-07 NOTE — Assessment & Plan Note (Signed)
Chronic.  Controlled.  Continue Synthroid 0.05 mg daily

## 2023-03-07 NOTE — Assessment & Plan Note (Signed)
Chronic.  Not occurring too frequently.  Side effects to triptans.  Will trial Nurtec 75 mg daily as needed migraine.

## 2023-03-08 NOTE — Progress Notes (Signed)
Labs look great.  Good cholesterol (HDL)has improved with exercise-keep it up!!! 2.  Thyroid-increase to 0.075(send in #90/1).  Repeat tsh 2-3 months

## 2023-03-09 ENCOUNTER — Other Ambulatory Visit: Payer: Self-pay | Admitting: *Deleted

## 2023-03-09 DIAGNOSIS — Z23 Encounter for immunization: Secondary | ICD-10-CM

## 2023-03-09 DIAGNOSIS — E038 Other specified hypothyroidism: Secondary | ICD-10-CM

## 2023-03-09 MED ORDER — LEVOTHYROXINE SODIUM 75 MCG PO TABS
75.0000 ug | ORAL_TABLET | Freq: Every day | ORAL | 1 refills | Status: AC
Start: 2023-03-09 — End: ?

## 2023-03-20 ENCOUNTER — Other Ambulatory Visit (HOSPITAL_COMMUNITY): Payer: Self-pay

## 2023-03-20 ENCOUNTER — Telehealth: Payer: Self-pay | Admitting: Pharmacy Technician

## 2023-03-20 NOTE — Telephone Encounter (Signed)
Pharmacy Patient Advocate Encounter  Received notification from [facility] that Prior Authorization for Nurtec 75MG  dispersible tablets has been APPROVED from 03/20/2023 to 09/16/2023. Ran test claim, Copay is $60.00. This test claim was processed through [facility]- copay amounts may vary at other pharmacies due to pharmacy/plan contracts, or as the patient moves through the different stages of their insurance plan.   PA #/Case ID/Reference #:  [id number] Key: [id number]

## 2023-05-12 ENCOUNTER — Telehealth: Payer: Federal, State, Local not specified - PPO | Admitting: Physician Assistant

## 2023-05-12 DIAGNOSIS — J069 Acute upper respiratory infection, unspecified: Secondary | ICD-10-CM

## 2023-05-12 MED ORDER — BENZONATATE 100 MG PO CAPS: 100.0000 mg | ORAL_CAPSULE | Freq: Two times a day (BID) | ORAL | 0 refills | Status: DC | PRN

## 2023-05-12 MED ORDER — FLUTICASONE PROPIONATE 50 MCG/ACT NA SUSP
2.0000 | Freq: Every day | NASAL | 0 refills | Status: DC
Start: 2023-05-12 — End: 2023-09-14

## 2023-05-12 NOTE — Progress Notes (Signed)

## 2023-05-12 NOTE — Progress Notes (Signed)
I have spent 5 minutes in review of e-visit questionnaire, review and updating patient chart, medical decision making and response to patient.   Mia Milan Cody Jacklynn Dehaas, PA-C    

## 2023-07-06 ENCOUNTER — Ambulatory Visit: Payer: Self-pay | Admitting: Family Medicine

## 2023-09-14 ENCOUNTER — Telehealth: Admitting: Physician Assistant

## 2023-09-14 DIAGNOSIS — J069 Acute upper respiratory infection, unspecified: Secondary | ICD-10-CM | POA: Diagnosis not present

## 2023-09-14 MED ORDER — ALBUTEROL SULFATE HFA 108 (90 BASE) MCG/ACT IN AERS: 1.0000 | INHALATION_SPRAY | Freq: Four times a day (QID) | RESPIRATORY_TRACT | 0 refills | Status: DC | PRN

## 2023-09-14 MED ORDER — FLUTICASONE PROPIONATE 50 MCG/ACT NA SUSP: 2.0000 | Freq: Every day | NASAL | 0 refills | Status: DC

## 2023-09-14 MED ORDER — PSEUDOEPH-BROMPHEN-DM 30-2-10 MG/5ML PO SYRP: 5.0000 mL | ORAL_SOLUTION | Freq: Four times a day (QID) | ORAL | 0 refills | Status: DC | PRN

## 2023-09-14 NOTE — Progress Notes (Signed)
 E-Visit for Upper Respiratory Infection   We are sorry you are not feeling well.  Here is how we plan to help!  Based on what you have shared with me, it looks like you may have a viral upper respiratory infection.  Upper respiratory infections are caused by a large number of viruses; however, rhinovirus is the most common cause.   Symptoms vary from person to person, with common symptoms including sore throat, cough, fatigue or lack of energy and feeling of general discomfort.  A low-grade fever of up to 100.4 may present, but is often uncommon.  Symptoms vary however, and are closely related to a person's age or underlying illnesses.  The most common symptoms associated with an upper respiratory infection are nasal discharge or congestion, cough, sneezing, headache and pressure in the ears and face.  These symptoms usually persist for about 3 to 10 days, but can last up to 2 weeks.  It is important to know that upper respiratory infections do not cause serious illness or complications in most cases.    Upper respiratory infections can be transmitted from person to person, with the most common method of transmission being a person's hands.  The virus is able to live on the skin and can infect other persons for up to 2 hours after direct contact.  Also, these can be transmitted when someone coughs or sneezes; thus, it is important to cover the mouth to reduce this risk.  To keep the spread of the illness at bay, good hand hygiene is very important.  This is an infection that is most likely caused by a virus. There are no specific treatments other than to help you with the symptoms until the infection runs its course.  We are sorry you are not feeling well.  Here is how we plan to help!   For nasal congestion, you may use an oral decongestants such as Mucinex D or if you have glaucoma or high blood pressure use plain Mucinex.  Saline nasal spray or nasal drops can help and can safely be used as often as  needed for congestion.  For your congestion and runny nose, I have prescribed Fluticasone nasal spray one spray in each nostril twice a day  If you do not have a history of heart disease, hypertension, diabetes or thyroid disease, prostate/bladder issues or glaucoma, you may also use Sudafed to treat nasal congestion.  It is highly recommended that you consult with a pharmacist or your primary care physician to ensure this medication is safe for you to take.     For cough I have prescribed for you Bromfed DM Cough syrup Take 5mL every 6 hours as needed for cough and Albuterol inhaler Use 1-2 puffs every 6 hours as needed for shortness of breath, chest tightness, and/or wheezing.   If you have a sore or scratchy throat, use a saltwater gargle-  to  teaspoon of salt dissolved in a 4-ounce to 8-ounce glass of warm water.  Gargle the solution for approximately 15-30 seconds and then spit.  It is important not to swallow the solution.  You can also use throat lozenges/cough drops and Chloraseptic spray to help with throat pain or discomfort.  Warm or cold liquids can also be helpful in relieving throat pain.  For headache, pain or general discomfort, you can use Ibuprofen or Tylenol as directed.   Some authorities believe that zinc sprays or the use of Echinacea may shorten the course of your symptoms.  HOME CARE Only take medications as instructed by your medical team. Be sure to drink plenty of fluids. Water is fine as well as fruit juices, sodas and electrolyte beverages. You may want to stay away from caffeine or alcohol. If you are nauseated, try taking small sips of liquids. How do you know if you are getting enough fluid? Your urine should be a pale yellow or almost colorless. Get rest. Taking a steamy shower or using a humidifier may help nasal congestion and ease sore throat pain. You can place a towel over your head and breathe in the steam from hot water coming from a faucet. Using a  saline nasal spray works much the same way. Cough drops, hard candies and sore throat lozenges may ease your cough. Avoid close contacts especially the very young and the elderly Cover your mouth if you cough or sneeze Always remember to wash your hands.   GET HELP RIGHT AWAY IF: You develop worsening fever. If your symptoms do not improve within 10 days You develop yellow or green discharge from your nose over 3 days. You have coughing fits You develop a severe head ache or visual changes. You develop shortness of breath, difficulty breathing or start having chest pain Your symptoms persist after you have completed your treatment plan  MAKE SURE YOU  Understand these instructions. Will watch your condition. Will get help right away if you are not doing well or get worse.  Thank you for choosing an e-visit.  Your e-visit answers were reviewed by a board certified advanced clinical practitioner to complete your personal care plan. Depending upon the condition, your plan could have included both over the counter or prescription medications.  Please review your pharmacy choice. Make sure the pharmacy is open so you can pick up prescription now. If there is a problem, you may contact your provider through [employer] and have the prescription routed to another pharmacy.  Your safety is important to us . If you have drug allergies check your prescription carefully.   For the next 24 hours you can use MyChart to ask questions about today's visit, request a non-urgent call back, or ask for a work or school excuse. You will get an email in the next two days asking about your experience. I hope that your e-visit has been valuable and will speed your recovery.     I have spent 5 minutes in review of e-visit questionnaire, review and updating patient chart, medical decision making and response to patient.   Angelia Kelp, PA-C

## 2023-09-17 ENCOUNTER — Telehealth: Admitting: Physician Assistant

## 2023-09-17 DIAGNOSIS — H9201 Otalgia, right ear: Secondary | ICD-10-CM

## 2023-09-17 DIAGNOSIS — H6991 Unspecified Eustachian tube disorder, right ear: Secondary | ICD-10-CM

## 2023-09-17 MED ORDER — IPRATROPIUM BROMIDE 0.03 % NA SOLN: 2.0000 | Freq: Two times a day (BID) | NASAL | 0 refills | Status: DC

## 2023-09-17 MED ORDER — AMOXICILLIN 875 MG PO TABS
875.0000 mg | ORAL_TABLET | Freq: Two times a day (BID) | ORAL | 0 refills | Status: AC
Start: 2023-09-17 — End: 2023-09-27

## 2023-09-17 NOTE — Progress Notes (Signed)
 I have spent 5 minutes in review of e-visit questionnaire, review and updating patient chart, medical decision making and response to patient.   Piedad Climes, PA-C

## 2023-09-17 NOTE — Progress Notes (Signed)
 E-Visit for Ear Pain - Eustachian Tube Dysfunction   We are sorry that you are not feeling well. Here is how we plan to help!  Based on what you have shared with me it looks like you have Eustachian Tube Dysfunction.  Eustachian Tube Dysfunction is a condition where the tubes that connect your middle ears to your upper throat become blocked. This can lead to discomfort, hearing difficulties and a feeling of fullness in your ear. Eustachian tube dysfunction usually resolves itself in a few days. The usual symptoms include: Hearing problems Tinnitus, or ringing in your ears Clicking or popping sounds A feeling of fullness in your ears Pain that mimics an ear infection Dizziness, vertigo or balance problems A "tickling" sensation in your ears  ?Eustachian tube dysfunction symptoms may get worse in higher altitudes. This is called barotrauma, and it can happen while scuba diving, flying in an airplane or driving in the mountains.   What causes eustachian tube dysfunction? Allergies and infections (like the common cold and the flu) are the most common causes of eustachian tube dysfunction. These conditions can cause inflammation and mucus buildup, leading to blockage. GERD, or chronic acid reflux, can also cause ETD. This is because stomach acid can back up into your throat and result in inflammation. As mentioned above, altitude changes can also cause ETD.   What are some common eustachian tube dysfunction treatments? In most cases, treatment isn't necessary because ETD often resolves on its own. However, you might need treatment if your symptoms linger for more than two weeks.    Eustachian tube dysfunction treatment depends on the cause and the severity of your condition. Treatments may include home remedies, medications or, in severe cases, surgery.     HOME CARE: Sometimes simple home remedies can help with mild cases of eustachian tube dysfunction. To try and clear the blockage, you  can: Chew gum. Yawn. Swallow. Try the Valsalva maneuver (breathing out forcefully while closing your mouth and pinching your nostrils). Use a saline spray to clear out nasal passages.  MEDICATIONS: Over-the-counter medications can help if allergies are causing eustachian tube dysfunction. Try antihistamines (like cetirizine or diphenhydramine) to ease your symptoms. If you have discomfort, pain relievers -- such as acetaminophen or ibuprofen -- can help.  Sometimes intranasal glucocorticosteroids (like Flonase or Nasacort) help.  I have prescribed Ipratropium Bromide nasal spray 0.03% two sprays in each nostril 2-3 times a day. Giving severity of pain, I am worried about a middle ear infection, so I have added on Amoxicillin to take twice daily for 10 days as well.     GET HELP RIGHT AWAY IF: Fever is over 102.2 degrees. You develop progressive ear pain or hearing loss. Ear symptoms persist longer than 3 days after treatment.  MAKE SURE YOU: Understand these instructions. Will watch your condition. Will get help right away if you are not doing well or get worse.  Thank you for choosing an e-visit.  Your e-visit answers were reviewed by a board certified advanced clinical practitioner to complete your personal care plan. Depending upon the condition, your plan could have included both over the counter or prescription medications.  Please review your pharmacy choice. Make sure the pharmacy is open so you can pick up the prescription now. If there is a problem, you may contact your provider through [employer] and have the prescription routed to another pharmacy.  Your safety is important to [place]. If you have drug allergies check your prescription carefully.  For the next 24 hours you can use MyChart to ask questions about today's visit, request a non-urgent call back, or ask for a work or school excuse. You will get an email with a survey after your eVisit asking about your  experience. We would appreciate your feedback. I hope that your e-visit has been valuable and will aid in your recovery.

## 2023-09-30 ENCOUNTER — Other Ambulatory Visit (HOSPITAL_COMMUNITY): Payer: Self-pay

## 2024-05-08 ENCOUNTER — Telehealth

## 2024-05-08 DIAGNOSIS — J029 Acute pharyngitis, unspecified: Secondary | ICD-10-CM

## 2024-05-09 NOTE — Progress Notes (Signed)
 We are sorry that you are not feeling well.  Here is how we plan to help!  Your symptoms indicate a likely viral infection (Pharyngitis).   Pharyngitis is inflammation in the back of the throat which can cause a sore throat, scratchiness and sometimes difficulty swallowing.   Pharyngitis is typically caused by a respiratory virus and will just run its course.  Please keep in mind that your symptoms could last up to 10 days.    For throat pain, we recommend over the counter oral pain relief medications such as acetaminophen  or aspirin, or anti-inflammatory medications such as ibuprofen  or naproxen sodium.  Topical treatments such as oral throat lozenges or sprays may be used as needed.   Avoid close contact with loved ones, especially the very young and elderly.  Remember to wash your hands thoroughly throughout the day as this is the number one way to prevent the spread of infection! We also recommend that you periodically wipe down door knobs and counters with disinfectant.  After careful review of your answers, I would not recommend and antibiotic for your condition.  Antibiotics should not be used to treat conditions that we suspect are caused by viruses like the virus that causes the common cold or flu.  Providers prescribe antibiotics to treat infections caused by bacteria. Antibiotics are very powerful in treating bacterial infections when they are used properly.  To maintain their effectiveness, they should be used only when necessary.  Overuse of antibiotics has resulted in the development of super bugs that are resistant to treatment!    Some people with strep throat, however, can have atypical symptoms. As such, if anything continued to progress despite treatment recommendations, you may need formal testing in clinic or office.  Home Care: Only take medications as instructed by your medical team. Do not drink alcohol while taking these medications. A steam or ultrasonic humidifier can help  congestion.  You can place a towel over your head and breathe in the steam from hot water coming from a faucet. Avoid close contacts especially the very young and the elderly. Cover your mouth when you cough or sneeze. Always remember to wash your hands.  Get Help Right Away If: You develop worsening fever or throat pain. You develop a severe head ache or visual changes. Your symptoms persist after you have completed your treatment plan.  Make sure you Understand these instructions. Will watch your condition. Will get help right away if you are not doing well or get worse.  Your e-visit answers were reviewed by a board certified advanced clinical practitioner to complete your personal care plan.  Depending on the condition, your plan could have included both over the counter or prescription medications.  If there is a problem please reply once you have received a response from your provider.  Your safety is important to us .  If you have drug allergies check your prescription carefully.    You can use MyChart to ask questions about todays visit, request a non-urgent call back, or ask for a work or school excuse for 24 hours related to this e-Visit. If it has been greater than 24 hours you will need to follow up with your provider, or enter a new e-Visit to address those concerns.  You will get an e-mail in the next two days asking about your experience.  I hope that your e-visit has been valuable and will speed your recovery. Thank you for using e-visits.   I have spent 5 minutes  in review of e-visit questionnaire, review and updating patient chart, medical decision making and response to patient.   Elsie Velma Lunger, PA-C

## 2024-05-12 ENCOUNTER — Telehealth: Admitting: Physician Assistant

## 2024-05-12 DIAGNOSIS — H6992 Unspecified Eustachian tube disorder, left ear: Secondary | ICD-10-CM

## 2024-05-13 NOTE — Progress Notes (Signed)
°  Because of the issue, I feel your condition warrants further evaluation and I recommend that you be seen in a face-to-face visit for a thorough ear exam.   NOTE: There will be NO CHARGE for this E-Visit   If you are having a true medical emergency, please call 911.     For an urgent face to face visit, [place] has multiple urgent care centers for your convenience.  Click the link below for the full list of locations and hours, walk-in wait times, appointment scheduling options and driving directions:  Urgent Care - [place], [place], [place], [place], [place], [place]  [place]     Your MyChart E-visit questionnaire answers were reviewed by a board certified advanced clinical practitioner to complete your personal care plan based on your specific symptoms.    Thank you for using e-Visits.

## 2024-06-17 ENCOUNTER — Telehealth: Payer: Self-pay | Admitting: Family Medicine

## 2024-06-17 NOTE — Telephone Encounter (Signed)
 LVM to be seen sooner if they want to by any provider.

## 2024-06-28 ENCOUNTER — Ambulatory Visit: Admitting: Family Medicine

## 2024-06-29 ENCOUNTER — Ambulatory Visit (INDEPENDENT_AMBULATORY_CARE_PROVIDER_SITE_OTHER): Admitting: Family

## 2024-06-29 ENCOUNTER — Encounter: Payer: Self-pay | Admitting: Family

## 2024-06-29 VITALS — BP 122/88 | HR 71 | Temp 97.7°F | Ht 65.4 in | Wt 318.4 lb

## 2024-06-29 DIAGNOSIS — R21 Rash and other nonspecific skin eruption: Secondary | ICD-10-CM | POA: Diagnosis not present

## 2024-06-29 DIAGNOSIS — Z23 Encounter for immunization: Secondary | ICD-10-CM | POA: Diagnosis not present

## 2024-06-29 DIAGNOSIS — L989 Disorder of the skin and subcutaneous tissue, unspecified: Secondary | ICD-10-CM

## 2024-06-29 MED ORDER — TRIAMCINOLONE ACETONIDE 0.5 % EX OINT: 1.0000 | TOPICAL_OINTMENT | Freq: Two times a day (BID) | CUTANEOUS | 1 refills | Status: AC

## 2024-06-29 NOTE — Progress Notes (Signed)
 "  Patient ID: Tanya Hanson, female    DOB: [birth year], 29 y.o.   MRN: [id number]  Chief Complaint  Patient presents with   Itchy Skin    Red, dry, itchy skin under right breast. Started about a month ago. Has used moisturizer and Benadryl  but did switch soaps to [employer]   Discussed the use of AI scribe software for clinical note transcription with the patient, who gave verbal consent to proceed.  History of Present Illness Tanya Hanson is a 29 year old female who presents with a rash on her breast and abdomen.  She reports an itchy, flaky rash that began on her right breast and has spread to her upper right abdomen and side. It started as small spots and became raised and thickened. Itching is worse at night, and she likely scratches in her sleep. She had severe eczema as a child but feels this rash is different. Moisturizers and nighttime Benadryl  have not helped, and the rash continues to flake. She has sensitive skin and uses fragrance-free detergent, avoids fabric softeners, and uses hypoallergenic body washes since December, but the rash has persisted. No similar rash on knees, elbows, or scalp. She has not tried topical creams for this rash yet.  Assessment & Plan Skin rash Rash on right breast, stomach, and side with itching, flaking, and thickened, plaque on upper right abdomen. Fungal infection vs psoriasis.  - Prescribed triamcinolone  0.5% ointment. Apply thin layer to affected areas twice daily for up to one week. - Sending DERM referral, concern for spreading. - Advised to report if rash worsens or does not improve after one week. - Provided refill for ointment if needed.  General Health Maintenance Received flu shot. Requires follow-up for thyroid  management and lab checks. - Schedule follow-up appointment with Dr Wendolyn for thyroid  management and lab check.   Subjective:    Outpatient Medications Prior to Visit  Medication Sig Dispense Refill   levothyroxine   (SYNTHROID ) 75 MCG tablet Take 1 tablet (75 mcg total) by mouth daily. 90 tablet 1   No facility-administered medications prior to visit.   Past Medical History:  Diagnosis Date   Allergy    Generalized anxiety disorder    Hirsutism    Hypothyroidism    Irregular menstrual cycle    PCOS (polycystic ovarian syndrome)    History reviewed. No pertinent surgical history. Allergies[1]    Objective:    Physical Exam Vitals and nursing note reviewed.  Constitutional:      Appearance: Normal appearance.  Cardiovascular:     Rate and Rhythm: Normal rate and regular rhythm.  Pulmonary:     Effort: Pulmonary effort is normal.     Breath sounds: Normal breath sounds.  Musculoskeletal:        General: Normal range of motion.  Skin:    General: Skin is warm and dry.     Findings: Rash (under right breast, diffuse smooth dark discoloration and thickened plaque on right upper abdomen, see pic attached) present.  Neurological:     Mental Status: She is alert.  Psychiatric:        Mood and Affect: Mood normal.        Behavior: Behavior normal.     BP 122/88 (BP Location: Right Arm, Patient Position: Sitting)   Pulse 71   Temp 97.7 F (36.5 C) (Temporal)   Ht 5' 5.4 (1.661 m)   Wt (!) 318 lb 6.4 oz (144.4 kg)   LMP 04/06/2024 (Approximate)  SpO2 97%   BMI 52.34 kg/m  Wt Readings from Last 3 Encounters:  06/29/24 (!) 318 lb 6.4 oz (144.4 kg)  03/06/23 (!) 311 lb 6 oz (141.2 kg)  04/08/22 (!) 317 lb 8 oz (144 kg)      Daanya Lanphier, NP     [1]  Allergies Allergen Reactions   Pineapple Itching and Swelling   "

## 2024-06-30 ENCOUNTER — Ambulatory Visit: Payer: Self-pay | Admitting: Family Medicine

## 2024-06-30 ENCOUNTER — Encounter: Payer: Self-pay | Admitting: Family Medicine

## 2024-06-30 ENCOUNTER — Ambulatory Visit: Admitting: Family Medicine

## 2024-06-30 ENCOUNTER — Other Ambulatory Visit (HOSPITAL_COMMUNITY)
Admission: RE | Admit: 2024-06-30 | Discharge: 2024-06-30 | Disposition: A | Discharge status: Home / Self Care | Source: Ambulatory Visit | Attending: Family Medicine | Admitting: Family Medicine

## 2024-06-30 VITALS — BP 132/80 | HR 74 | Temp 97.7°F | Ht 65.4 in | Wt 317.5 lb

## 2024-06-30 DIAGNOSIS — E282 Polycystic ovarian syndrome: Secondary | ICD-10-CM

## 2024-06-30 DIAGNOSIS — L309 Dermatitis, unspecified: Secondary | ICD-10-CM

## 2024-06-30 DIAGNOSIS — Z01419 Encounter for gynecological examination (general) (routine) without abnormal findings: Secondary | ICD-10-CM | POA: Diagnosis present

## 2024-06-30 DIAGNOSIS — Z124 Encounter for screening for malignant neoplasm of cervix: Secondary | ICD-10-CM | POA: Insufficient documentation

## 2024-06-30 DIAGNOSIS — E038 Other specified hypothyroidism: Secondary | ICD-10-CM

## 2024-06-30 DIAGNOSIS — N926 Irregular menstruation, unspecified: Secondary | ICD-10-CM

## 2024-06-30 LAB — CBC WITH DIFFERENTIAL/PLATELET
Basophils Absolute: 0 10*3/uL (ref 0.0–0.1)
Basophils Relative: 0.5 % (ref 0.0–3.0)
Eosinophils Absolute: 0.1 10*3/uL (ref 0.0–0.7)
Eosinophils Relative: 1 % (ref 0.0–5.0)
HCT: 40.4 % (ref 36.0–46.0)
Hemoglobin: 13.4 g/dL (ref 12.0–15.0)
Lymphocytes Relative: 33.3 % (ref 12.0–46.0)
Lymphs Abs: 2.5 10*3/uL (ref 0.7–4.0)
MCHC: 33.2 g/dL (ref 30.0–36.0)
MCV: 93.1 fl (ref 78.0–100.0)
Monocytes Absolute: 0.5 10*3/uL (ref 0.1–1.0)
Monocytes Relative: 6 % (ref 3.0–12.0)
Neutro Abs: 4.5 10*3/uL (ref 1.4–7.7)
Neutrophils Relative %: 59.2 % (ref 43.0–77.0)
Platelets: 252 10*3/uL (ref 150.0–400.0)
RBC: 4.34 Mil/uL (ref 3.87–5.11)
RDW: 13.5 % (ref 11.5–15.5)
WBC: 7.6 10*3/uL (ref 4.0–10.5)

## 2024-06-30 LAB — COMPREHENSIVE METABOLIC PANEL WITH GFR
ALT: 21 U/L (ref 3–35)
AST: 16 U/L (ref 5–37)
Albumin: 4.3 g/dL (ref 3.5–5.2)
Alkaline Phosphatase: 66 U/L (ref 39–117)
BUN: 10 mg/dL (ref 6–23)
CO2: 29 meq/L (ref 19–32)
Calcium: 9.3 mg/dL (ref 8.4–10.5)
Chloride: 104 meq/L (ref 96–112)
Creatinine, Ser: 0.86 mg/dL (ref 0.40–1.20)
GFR: 92.79 mL/min
Glucose, Bld: 94 mg/dL (ref 70–99)
Potassium: 3.8 meq/L (ref 3.5–5.1)
Sodium: 139 meq/L (ref 135–145)
Total Bilirubin: 0.4 mg/dL (ref 0.2–1.2)
Total Protein: 7.4 g/dL (ref 6.0–8.3)

## 2024-06-30 LAB — LIPID PANEL
Cholesterol: 193 mg/dL (ref 28–200)
HDL: 39 mg/dL — ABNORMAL LOW
LDL Cholesterol: 131 mg/dL — ABNORMAL HIGH (ref 10–99)
NonHDL: 153.87
Total CHOL/HDL Ratio: 5
Triglycerides: 115 mg/dL (ref 10.0–149.0)
VLDL: 23 mg/dL (ref 0.0–40.0)

## 2024-06-30 LAB — HEMOGLOBIN A1C: Hgb A1c MFr Bld: 5.2 % (ref 4.6–6.5)

## 2024-06-30 LAB — TSH: TSH: 3.93 u[IU]/mL (ref 0.35–5.50)

## 2024-06-30 MED ORDER — NORETHINDRONE ACET-ETHINYL EST 1-20 MG-MCG PO TABS: 1.0000 | ORAL_TABLET | Freq: Every day | ORAL | 11 refills | Status: AC

## 2024-06-30 MED ORDER — KETOCONAZOLE 2 % EX CREA: 1.0000 | TOPICAL_CREAM | Freq: Two times a day (BID) | CUTANEOUS | 0 refills | Status: AC

## 2024-06-30 MED ORDER — FLUCONAZOLE 150 MG PO TABS: 150.0000 mg | ORAL_TABLET | ORAL | 0 refills | Status: AC | PRN

## 2024-06-30 NOTE — Progress Notes (Signed)
 Labs look ok except  Your cholesterol levels are elevated.  Work on low cholesterol and lower carbs/sugars diet and  get exercise to try to lower your cholesterol.  Thyroid  at an acceptable level-we can stay off and reck in 3 months

## 2024-06-30 NOTE — Patient Instructions (Signed)
 It was very nice to see you today!  Check insurance on wt loss clinic and nutritionist   PLEASE NOTE:  If you had any lab tests please let us  know if you have not heard back within a few days. You may see your results on MyChart before we have a chance to review them but we will give you a call once they are reviewed by us . If we ordered any referrals today, please let us  know if you have not heard from their office within the next week.   Please try these tips to maintain a healthy lifestyle:  Eat most of your calories during the day when you are active. Eliminate processed foods including packaged sweets (pies, cakes, cookies), reduce intake of potatoes, white bread, white pasta, and white rice. Look for whole grain options, oat flour or almond flour.  Each meal should contain half fruits/vegetables, one quarter protein, and one quarter carbs (no bigger than a computer mouse).  Cut down on sweet beverages. This includes juice, soda, and sweet tea. Also watch fruit intake, though this is a healthier sweet option, it still contains natural sugar! Limit to 3 servings daily.  Drink at least 1 glass of water with each meal and aim for at least 8 glasses per day  Exercise at least 150 minutes every week.

## 2024-06-30 NOTE — Progress Notes (Signed)
 " Phone [phone]   Subjective:   Patient is a 29 y.o. female presenting for annual physical.    Chief Complaint  Patient presents with   Annual Exam    Pt is here for CPE    Discussed the use of AI scribe software for clinical note transcription with the patient, who gave verbal consent to proceed.  History of Present Illness Tanya Hanson is a 29 year old female with PCOS and hypothyroidism who presents for an annual physical exam and Pap smear.  She has not been taking her levothyroxine  75 mcg due to insurance issues and is unsure of the last time she took it. She has not noticed any difference in symptoms without the medication.  She is experiencing difficulties with weight management despite regular exercise and meal prepping. She exercises four times a week, walks her dog regularly, and previously worked in a physically active job. She is frustrated with her weight, which was 318 pounds at her last weigh-in, and notes fluctuations in clothing fit. She has attempted to get insurance coverage for weight loss medication without success.  She reports irregular and painful menstrual cycles, with the last period occurring in November and lasting about two weeks. Prior to that, her last period was in January. She has a history of PCOS and has previously been on birth control in high school to regulate her cycles, but was non-compliant at that time. She has not been able to see a gynecologist due to insurance issues.  She mentions a skin issue on her breasts for which she was given a cream by another doctor. She started using the cream yesterday.  No major headaches, dizziness, passing out, blurry vision, double vision, sore throat, hoarseness, trouble swallowing, chest pains, heart racing, skipping beats, coughing, wheezing, shortness of breath, vomiting, diarrhea, constipation, and trouble urinating. She drinks water with flavoring and avoids soda due to skin breakouts.     See  problem oriented charting- ROS- ROS: Gen: no fever, chills  Skin: HPI ENT: no ear pain, ear drainage, nasal congestion, rhinorrhea, sinus pressure, sore throat Eyes: no blurry vision, double vision Resp: no cough, wheeze,SOB CV: no CP, palpitations, LE edema,  GI: no heartburn, n/v/d/c, abd pain GU: no dysuria, urgency, frequency, hematuria MSK: no joint pain, myalgias, back pain Neuro: no dizziness, headache, weakness, vertigo Psych: no depression, anxiety, insomnia, SI   The following were reviewed and entered/updated in epic: Past Medical History:  Diagnosis Date   Allergy    Generalized anxiety disorder    Hirsutism    Hypothyroidism    Irregular menstrual cycle    PCOS (polycystic ovarian syndrome)    Patient Active Problem List   Diagnosis Date Noted   Other specified hypothyroidism 03/07/2023   Migraine without aura and without status migrainosus, not intractable 03/07/2023   Intractable tension-type headache 03/23/2019   Hirsutism 08/02/2018   PCOS (polycystic ovarian syndrome) 08/02/2018   Irregular periods/menstrual cycles 08/02/2018   Class 3 severe obesity due to excess calories without serious comorbidity with body mass index (BMI) of 40.0 to 44.9 in adult [facility]) 08/02/2018   Generalized anxiety disorder 08/02/2018   History reviewed. No pertinent surgical history.  Family History  Problem Relation Age of Onset   Gout Father    Cancer Paternal Grandmother 26       breast   Heart disease Paternal Grandmother    COPD Paternal Grandfather     Medications- reviewed and updated Current Outpatient Medications  Medication Sig Dispense  Refill   fluconazole  (DIFLUCAN ) 150 MG tablet Take 1 tablet (150 mg total) by mouth every three (3) days as needed. 2 tablet 0   ketoconazole  (NIZORAL ) 2 % cream Apply 1 Application topically 2 (two) times daily. 60 g 0   levothyroxine  (SYNTHROID ) 75 MCG tablet Take 1 tablet (75 mcg total) by mouth daily. 90 tablet 1    norethindrone -ethinyl estradiol (LOESTRIN 1/20, 21,) 1-20 MG-MCG tablet Take 1 tablet by mouth daily. 28 tablet 11   triamcinolone  ointment (KENALOG ) 0.5 % Apply 1 Application topically 2 (two) times daily. Apply to right breast and abdomen. 30 g 1   No current facility-administered medications for this visit.    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 DP 2+B LUNGS: CTAB. No wheezes ABDOMEN:  BS+, soft, NTND, No HSM, no masses EXT:  no edema MSK: no gross abnormalities. MS 5/5 all 4 NEURO: A&O x3.  CN II-XII intact.  PSYCH: normal mood. Good eye contact   Skin: acanthosis neck.  Area between breasts and under R>L breast hyperpigmented, irrit, w/some redness and ?satellite lesions  Breasts: Examined lying .              Right:   Without masses, retractions,  nipple discharge or axillary adenopathy.               Left:     Without masses, retractions, nipple discharge or axillary adenopathy. Genitourinary              Inguinal/mons:  Normal without inguinal adenopathy             External genitalia:  Normal appearing vulva with no masses, tenderness, or lesions-some emaceration             BUS/Urethra/Skene's glands:  Normal             Vagina:  Normal appearing with normal color and discharge-but more of it, no lesions             Cervix:  Hard to visualize             Uterus:  limited by body habitus              Adnexa/parametria:                           Rt:        Normal in size, without masses or tenderness.                         Lt:        Normal in size, without masses or tenderness.             Anus and perineum: Normal  Chaperone present SK     Assessment and  Plan   Health Maintenance counseling: 1. Anticipatory guidance: Patient counseled regarding regular dental exams q6 months, eye exams,  avoiding smoking and second hand smoke, limiting alcohol to 1 beverage per day, no illicit drugs.   2. Risk factor reduction:  Advised patient of need for regular exercise and diet rich and fruits and vegetables to reduce risk of heart attack and stroke. Exercise- +.  Wt Readings from Last 3 Encounters:  06/30/24 (!) 317 lb 8 oz (144 kg)  06/29/24 (!) 318 lb 6.4 oz (144.4 kg)  03/06/23 (!) 311 lb 6 oz (141.2 kg)   3. Immunizations/screenings/ancillary studies Immunization History  Administered Date(s) Administered   DTaP 09/06/1997, 11/06/1997, 03/15/1998, 01/09/1999   Dtap, Unspecified 01/05/2003   Fluzone Influenza virus vaccine,trivalent (IIV3), split virus 02/25/2010, 03/10/2011   HIB (PRP-OMP) 09/06/1997, 11/06/1997, 08/20/1998   HPV Quadrivalent 12/25/2008, 02/26/2009, 07/04/2009   Hep B, Unspecified 04-07-1997, 06/21/1997, 03/15/1998   Hepatitis A, Ped/Adol-2 Dose 07/11/2014, 02/19/2015   IPV 09/06/1997, 11/06/1997, 01/09/1999   Influenza, Seasonal, Injecte, Preservative Fre 03/06/2023, 06/29/2024   Influenza,inj,Quad PF,6+ Mos 08/02/2018, 03/22/2019, 04/02/2021   MMR 08/20/1998, 01/05/2003   Meningococcal Conjugate 02/26/2009, 07/11/2014   PFIZER Comirnaty(Gray Top)Covid-19 Tri-Sucrose Vaccine 07/10/2020   PFIZER(Purple Top)SARS-COV-2 Vaccination 09/10/2019, 10/04/2019, 07/31/2020, 10/03/2020   PPD Test 11/27/2022   Polio, Unspecified 01/05/2003   Tdap 12/25/2008, 08/02/2018   Health Maintenance Due  Topic Date Due   Cervical Cancer Screening (Pap smear)  05/03/2024    4.  Cervical cancer screening: ordered 5. Skin cancer screening- advised regular sunscreen use. Denies worrisome, changing, or new skin lesions.  6. Birth control/STD check: female 7. Smoking associated screening: non smoker 8. Alcohol screening: neg  Well woman exam with routine gynecological exam -     Lipid panel -     Comprehensive metabolic panel with GFR -     CBC with Differential/Platelet -     Hemoglobin A1c -     TSH -     Cytology - PAP  Screening for cervical cancer -     Cytology - PAP  Other specified hypothyroidism -     TSH  PCOS (polycystic ovarian syndrome) -     Lipid panel -     Comprehensive metabolic panel with GFR -     CBC with Differential/Platelet -     Hemoglobin A1c -     Norethindrone  Acet-Ethinyl Est; Take 1 tablet by mouth daily.  Dispense: 28 tablet; Refill: 11  Dermatitis  Irregular periods/menstrual cycles  Other orders -     Ketoconazole ; Apply 1 Application topically 2 (two) times daily.  Dispense: 60 g; Refill: 0 -     Fluconazole ; Take 1 tablet (150 mg total) by mouth every three (3) days as needed.  Dispense: 2 tablet; Refill: 0    Assessment and Plan Assessment & Plan Woman's Wellness Visit   Routine wellness visit with no new surgeries or changes in family history. She does not smoke or consume significant alcohol. Engages in regular exercise and meal prepping, but weight management remains challenging. Insurance does not cover [place], and she is not interested in it. Discussed potential weight loss clinic and nutritionist options with new insurance. Encouraged continued exercise and healthy eating. Advised to check insurance coverage for weight loss clinic and nutritionist. Discussed potential dietary changes and calorie intake.  Polycystic ovarian syndrome   PCOS with irregular and painful menstrual cycles. Last menstrual period in November lasted two weeks. Previous birth control use was inconsistent. Discussed treatment  options  including birth control pills, metformin, and thyroid  medication. She prefers to start with birth control pills to regulate cycles. Prescribed birth control pills for cycle regulation. Scheduled follow-up in a few months to assess treatment efficacy.  Other specified hypothyroidism   Hypothyroidism with non-adherence to levothyroxine  due to insurance issues. No noticeable symptoms of hypothyroidism. Plan to reassess thyroid  levels after resuming medication. Will reassess thyroid  levels after resuming levothyroxine .  Dermatitis of breast   Dermatitis on the breasts, currently using prescribed cream. Follow-up with dermatologist if no improvement. ?yeast, seb derm, insulin resistance, other. Added ketoconazole  to steroids and for vagina-diflucan  orally     Recommended follow up: Return in about 3 months (around 09/28/2024) for ocp.  Lab/Order associations: fasting   Jenkins CHRISTELLA Carrel, MD     [1]  Allergies Allergen Reactions   Pineapple Itching and Swelling   "

## 2024-07-01 LAB — CYTOLOGY - PAP
Adequacy: ABSENT
Chlamydia: NEGATIVE
Comment: NEGATIVE
Comment: NEGATIVE
Comment: NORMAL
Diagnosis: NEGATIVE
Neisseria Gonorrhea: NEGATIVE
Trichomonas: NEGATIVE

## 2024-07-01 NOTE — Progress Notes (Signed)
 No infection seen, but no transformation zone on pap so may not be an adequate pap.  Should be repeated in few months, can discuss more at appt in April

## 2024-07-01 NOTE — Progress Notes (Signed)
Pt has read results.

## 2024-09-28 ENCOUNTER — Ambulatory Visit: Admitting: Family Medicine
